# Patient Record
Sex: Female | Born: 2000 | Race: White | Hispanic: No | Marital: Married | Smoking: Light tobacco smoker
Health system: Southern US, Community
[De-identification: ages and names within clinical notes are randomized; demographics above are authoritative.]

## PROBLEM LIST (undated history)

## (undated) DIAGNOSIS — E669 Obesity, unspecified: Secondary | ICD-10-CM

## (undated) DIAGNOSIS — F329 Major depressive disorder, single episode, unspecified: Secondary | ICD-10-CM

## (undated) DIAGNOSIS — F32A Depression, unspecified: Secondary | ICD-10-CM

## (undated) DIAGNOSIS — N912 Amenorrhea, unspecified: Secondary | ICD-10-CM

## (undated) DIAGNOSIS — N926 Irregular menstruation, unspecified: Secondary | ICD-10-CM

## (undated) DIAGNOSIS — G4730 Sleep apnea, unspecified: Secondary | ICD-10-CM

## (undated) DIAGNOSIS — M419 Scoliosis, unspecified: Secondary | ICD-10-CM

## (undated) HISTORY — DX: Obesity, unspecified: E66.9

## (undated) HISTORY — DX: Depression, unspecified: F32.A

## (undated) HISTORY — PX: TYMPANOSTOMY TUBE PLACEMENT: SHX32

## (undated) HISTORY — DX: Amenorrhea, unspecified: N91.2

## (undated) HISTORY — PX: TONSILLECTOMY: SUR1361

## (undated) HISTORY — DX: Irregular menstruation, unspecified: N92.6

## (undated) HISTORY — DX: Major depressive disorder, single episode, unspecified: F32.9

## (undated) HISTORY — PX: TONSILLECTOMY AND ADENOIDECTOMY: SHX28

## (undated) HISTORY — DX: Scoliosis, unspecified: M41.9

---
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
## 2019-01-05 ENCOUNTER — Ambulatory Visit (HOSPITAL_BASED_OUTPATIENT_CLINIC_OR_DEPARTMENT_OTHER): Payer: Medicaid Other | Attending: Internal Medicine | Admitting: Internal Medicine

## 2019-01-05 VITALS — Ht 71.0 in | Wt 356.0 lb

## 2019-01-05 DIAGNOSIS — G4733 Obstructive sleep apnea (adult) (pediatric): Secondary | ICD-10-CM | POA: Insufficient documentation

## 2019-01-05 DIAGNOSIS — R5383 Other fatigue: Secondary | ICD-10-CM | POA: Insufficient documentation

## 2019-01-05 DIAGNOSIS — R0683 Snoring: Secondary | ICD-10-CM | POA: Insufficient documentation

## 2019-01-12 ENCOUNTER — Other Ambulatory Visit: Payer: Self-pay | Admitting: Family Medicine

## 2019-01-12 DIAGNOSIS — F339 Major depressive disorder, recurrent, unspecified: Secondary | ICD-10-CM

## 2019-01-17 DIAGNOSIS — R0683 Snoring: Secondary | ICD-10-CM

## 2019-01-17 NOTE — Procedures (Signed)
  Patient Name: Connie Rivera, Connie Rivera Date: 01/05/2019 Gender: Female D.O.B: Dec 28, 2000 Age (years): 17 Referring Provider: Lucianne Lei Height (inches): 71 Interpreting Physician: Baird Lyons MD, ABSM Weight (lbs): 356 RPSGT: Lanae Boast BMI: 50 MRN: [id number] Neck Size: 18.00  CLINICAL INFORMATION Sleep Study Type: NPSG Indication for sleep study: Snoring Epworth Sleepiness Score: 13  SLEEP STUDY TECHNIQUE As per the AASM Manual for the Scoring of Sleep and Associated Events v2.3 (April 2016) with a hypopnea requiring 4% desaturations.  The channels recorded and monitored were frontal, central and occipital EEG, electrooculogram (EOG), submentalis EMG (chin), nasal and oral airflow, thoracic and abdominal wall motion, anterior tibialis EMG, snore microphone, electrocardiogram, and pulse oximetry.  MEDICATIONS Medications self-administered by patient taken the night of the study : none reported  SLEEP ARCHITECTURE The study was initiated at 11:18:31 PM and ended at 5:20:16 AM.  Sleep onset time was 9.9 minutes and the sleep efficiency was 38.4%%. The total sleep time was 139 minutes.  Stage REM latency was 303.5 minutes.  The patient spent 12.9%% of the night in stage N1 sleep, 77.7%% in stage N2 sleep, 4.3%% in stage N3 and 5% in REM.  Alpha intrusion was absent.  Supine sleep was 100.00%.  RESPIRATORY PARAMETERS The overall apnea/hypopnea index (AHI) was 2.6 per hour. There were 1 total apneas, including 1 obstructive, 0 central and 0 mixed apneas. There were 5 hypopneas and 6 RERAs.  The AHI during Stage REM sleep was 0.0 per hour.  AHI while supine was 2.6 per hour.  The mean oxygen saturation was 89.1%. The minimum SpO2 during sleep was 85.0%.  loud snoring was noted during this study.  CARDIAC DATA The 2 lead EKG demonstrated sinus rhythm. The mean heart rate was 62.6 beats per minute. Other EKG findings include: None.  LEG MOVEMENT DATA The  total PLMS were 0 with a resulting PLMS index of 0.0. Associated arousal with leg movement index was 0.0 .  IMPRESSIONS - No significant obstructive sleep apnea occurred during this study (AHI = 2.6/h). - No significant central sleep apnea occurred during this study (CAI = 0.0/h). - Oxygen desaturation was noted during this study (Min O2 = 85.0%). Supplemental O2 was added at 1 L/min per protocol for sustained saturation 88% or less. Mean sat 92.7%. - Time with saturaation 88% or less was 70.3 minutes. - The patient snored with loud snoring volume. - No cardiac abnormalities were noted during this study. - Clinically significant periodic limb movements did not occur during sleep. No significant associated arousals.  DIAGNOSIS - Nocturnal Hypoxemia (327.26 [G47.36 ICD-10]) - Primary snoring  RECOMMENDATIONS - Evaluate for nocturnal hypoxemia, possibly due to obesity- hypoventilation or cardiopulmonary disease - Be careful with alcohol, sedatives and other CNS depressants that may worsen sleep apnea and disrupt normal sleep architecture. - Sleep hygiene should be reviewed to assess factors that may improve sleep quality. - Weight management and regular exercise should be initiated or continued if appropriate.  [Electronically signed] 01/17/2019 02:58 PM  Baird Lyons MD, [place], American Board of Sleep Medicine   NPI: [id number]                           [place], [facility]  ELECTRONICALLY SIGNED ON:  01/17/2019, 2:53 PM [place] PH: [phone]   FX: [phone] [place]

## 2019-02-06 ENCOUNTER — Ambulatory Visit: Payer: Medicaid Other | Admitting: Family

## 2019-02-15 DIAGNOSIS — G43709 Chronic migraine without aura, not intractable, without status migrainosus: Secondary | ICD-10-CM | POA: Diagnosis not present

## 2019-02-15 DIAGNOSIS — Z01818 Encounter for other preprocedural examination: Secondary | ICD-10-CM | POA: Diagnosis not present

## 2019-02-15 DIAGNOSIS — R41840 Attention and concentration deficit: Secondary | ICD-10-CM | POA: Diagnosis not present

## 2019-02-15 DIAGNOSIS — M41126 Adolescent idiopathic scoliosis, lumbar region: Secondary | ICD-10-CM | POA: Diagnosis not present

## 2019-02-15 DIAGNOSIS — R0683 Snoring: Secondary | ICD-10-CM | POA: Diagnosis not present

## 2019-02-15 DIAGNOSIS — M25562 Pain in left knee: Secondary | ICD-10-CM | POA: Diagnosis not present

## 2019-02-15 DIAGNOSIS — F331 Major depressive disorder, recurrent, moderate: Secondary | ICD-10-CM | POA: Diagnosis not present

## 2019-02-15 DIAGNOSIS — K219 Gastro-esophageal reflux disease without esophagitis: Secondary | ICD-10-CM | POA: Diagnosis not present

## 2019-02-15 DIAGNOSIS — R7303 Prediabetes: Secondary | ICD-10-CM | POA: Diagnosis not present

## 2019-02-15 DIAGNOSIS — Z713 Dietary counseling and surveillance: Secondary | ICD-10-CM | POA: Diagnosis not present

## 2019-02-15 DIAGNOSIS — F419 Anxiety disorder, unspecified: Secondary | ICD-10-CM | POA: Diagnosis not present

## 2019-02-15 DIAGNOSIS — E282 Polycystic ovarian syndrome: Secondary | ICD-10-CM | POA: Diagnosis not present

## 2019-02-21 ENCOUNTER — Encounter: Payer: Self-pay | Admitting: Family

## 2019-02-21 ENCOUNTER — Ambulatory Visit: Payer: Medicaid Other | Admitting: Family Medicine

## 2019-02-21 ENCOUNTER — Ambulatory Visit (INDEPENDENT_AMBULATORY_CARE_PROVIDER_SITE_OTHER): Payer: Medicaid Other | Admitting: Family

## 2019-02-21 VITALS — BP 136/92 | HR 97 | Temp 97.7°F | Ht 71.0 in | Wt 355.0 lb

## 2019-02-21 DIAGNOSIS — J029 Acute pharyngitis, unspecified: Secondary | ICD-10-CM

## 2019-02-21 DIAGNOSIS — Z20818 Contact with and (suspected) exposure to other bacterial communicable diseases: Secondary | ICD-10-CM

## 2019-02-21 LAB — RAPID STREP SCREEN (MED CTR MEBANE ONLY): Strep Gp A Ag, IA W/Reflex: NEGATIVE

## 2019-02-21 LAB — CULTURE, GROUP A STREP

## 2019-02-21 MED ORDER — AMOXICILLIN 875 MG PO TABS: 875.0000 mg | ORAL_TABLET | Freq: Two times a day (BID) | ORAL | 0 refills | Status: DC

## 2019-02-21 NOTE — Patient Instructions (Signed)

## 2019-02-21 NOTE — Progress Notes (Signed)
Subjective:    Patient ID: Connie Rivera, female    DOB: November 04, 2001, 18 y.o.   MRN: [id number]  Chief Complaint  Patient presents with  . Sore Throat    for three days  . Cough   PT presents to the office today with complaints of sore throat that started 3 days ago. Reports her sister was just diagnosed with strep throat.  Sore Throat   This is a new problem. The current episode started in the past 7 days. The problem has been unchanged. There has been no fever. The pain is at a severity of 10/10. The pain is moderate. Associated symptoms include congestion, coughing, ear pain (left), headaches, swollen glands and trouble swallowing. She has had exposure to strep. Exposure to: sister. She has tried acetaminophen and gargles for the symptoms. The treatment provided mild relief.      Review of Systems  HENT: Positive for congestion, ear pain (left) and trouble swallowing.   Respiratory: Positive for cough.   Neurological: Positive for headaches.  All other systems reviewed and are negative.      Objective:   Physical Exam Vitals signs reviewed.  Constitutional:      General: She is not in acute distress.    Appearance: She is well-developed. She is obese.  HENT:     Head: Normocephalic and atraumatic.     Right Ear: External ear normal.     Nose: Mucosal edema and rhinorrhea present.     Mouth/Throat:     Pharynx: Posterior oropharyngeal erythema present.  Eyes:     Pupils: Pupils are equal, round, and reactive to light.  Neck:     Musculoskeletal: Normal range of motion and neck supple.     Thyroid: No thyromegaly.  Cardiovascular:     Rate and Rhythm: Normal rate and regular rhythm.     Heart sounds: Normal heart sounds. No murmur.  Pulmonary:     Effort: Pulmonary effort is normal. No respiratory distress.     Breath sounds: Normal breath sounds. No wheezing.  Abdominal:     General: Bowel sounds are normal. There is no distension.     Palpations: Abdomen is soft.      Tenderness: There is no abdominal tenderness.  Musculoskeletal: Normal range of motion.        General: No tenderness.  Skin:    General: Skin is warm and dry.  Neurological:     Mental Status: She is alert and oriented to person, place, and time.     Cranial Nerves: No cranial nerve deficit.     Deep Tendon Reflexes: Reflexes are normal and symmetric.  Psychiatric:        Behavior: Behavior normal.        Thought Content: Thought content normal.        Judgment: Judgment normal.       BP (!) 136/92   Pulse 97   Temp 97.7 F (36.5 C) (Oral)   Ht 5\' 11"  (1.803 m)   Wt (!) 355 lb (161 kg)   BMI 49.51 kg/m      Assessment & Plan:  MALEEHA HALLS comes in today with chief complaint of Sore Throat (for three days) and Cough   Diagnosis and orders addressed:  1. Sore throat - Rapid Strep Screen ([facility] ONLY)  2. Acute pharyngitis, unspecified etiology - Take meds as prescribed - Use a cool mist humidifier  -Use saline nose sprays frequently -Force fluids -For any cough  or congestion  Use plain Mucinex- regular strength or max strength is fine -For fever or aces or pains- take tylenol or ibuprofen. -Throat lozenges if help -New toothbrush in 3 days RTO if symptoms worsen or do not improve  - amoxicillin (AMOXIL) 875 MG tablet; Take 1 tablet (875 mg total) by mouth 2 (two) times daily.  Dispense: 14 tablet; Refill: 0  3. Exposure to strep throat    [name], FNP

## 2019-02-22 ENCOUNTER — Telehealth: Payer: Self-pay | Admitting: Family

## 2019-02-22 MED ORDER — BENZONATATE 100 MG PO CAPS: 100.0000 mg | ORAL_CAPSULE | Freq: Two times a day (BID) | ORAL | 0 refills | Status: DC | PRN

## 2019-02-22 NOTE — Telephone Encounter (Signed)
Patient aware and verbalizes understanding. 

## 2019-02-22 NOTE — Telephone Encounter (Signed)
I sent in Tessalon Perles for the patient. 

## 2019-02-22 NOTE — Telephone Encounter (Signed)
Please advise if you will send in cough medication.

## 2019-02-28 DIAGNOSIS — H5213 Myopia, bilateral: Secondary | ICD-10-CM | POA: Diagnosis not present

## 2019-05-13 ENCOUNTER — Other Ambulatory Visit: Payer: Self-pay | Admitting: Family

## 2019-05-13 DIAGNOSIS — F339 Major depressive disorder, recurrent, unspecified: Secondary | ICD-10-CM

## 2019-06-19 ENCOUNTER — Institutional Professional Consult (permissible substitution): Payer: Medicaid Other | Admitting: Pulmonary Disease

## 2019-06-22 DIAGNOSIS — Z68.41 Body mass index (BMI) pediatric, greater than or equal to 95th percentile for age: Secondary | ICD-10-CM | POA: Diagnosis not present

## 2019-06-22 DIAGNOSIS — R0683 Snoring: Secondary | ICD-10-CM | POA: Diagnosis not present

## 2019-06-22 DIAGNOSIS — F411 Generalized anxiety disorder: Secondary | ICD-10-CM | POA: Diagnosis not present

## 2019-06-22 DIAGNOSIS — K219 Gastro-esophageal reflux disease without esophagitis: Secondary | ICD-10-CM | POA: Diagnosis not present

## 2019-06-22 DIAGNOSIS — E282 Polycystic ovarian syndrome: Secondary | ICD-10-CM | POA: Diagnosis not present

## 2019-06-22 DIAGNOSIS — F419 Anxiety disorder, unspecified: Secondary | ICD-10-CM | POA: Diagnosis not present

## 2019-06-22 DIAGNOSIS — L732 Hidradenitis suppurativa: Secondary | ICD-10-CM | POA: Diagnosis not present

## 2019-06-22 DIAGNOSIS — R7303 Prediabetes: Secondary | ICD-10-CM | POA: Diagnosis not present

## 2019-06-22 DIAGNOSIS — G43709 Chronic migraine without aura, not intractable, without status migrainosus: Secondary | ICD-10-CM | POA: Diagnosis not present

## 2019-06-22 DIAGNOSIS — F331 Major depressive disorder, recurrent, moderate: Secondary | ICD-10-CM | POA: Diagnosis not present

## 2019-06-22 DIAGNOSIS — Z01818 Encounter for other preprocedural examination: Secondary | ICD-10-CM | POA: Diagnosis not present

## 2019-07-06 ENCOUNTER — Telehealth: Payer: Self-pay | Admitting: Family

## 2019-07-06 NOTE — Telephone Encounter (Signed)
LMOM giving approval, left number should they need anything else

## 2019-07-06 NOTE — Telephone Encounter (Signed)
This is fine 

## 2019-08-22 ENCOUNTER — Other Ambulatory Visit: Payer: Self-pay | Admitting: Family

## 2019-08-22 DIAGNOSIS — F339 Major depressive disorder, recurrent, unspecified: Secondary | ICD-10-CM

## 2019-09-04 ENCOUNTER — Telehealth: Payer: Self-pay | Admitting: Family

## 2019-09-04 DIAGNOSIS — Z3009 Encounter for other general counseling and advice on contraception: Secondary | ICD-10-CM

## 2019-09-04 NOTE — Telephone Encounter (Signed)
Referral placed.

## 2019-10-09 ENCOUNTER — Encounter: Payer: Self-pay | Admitting: Family Medicine

## 2019-10-09 ENCOUNTER — Ambulatory Visit (INDEPENDENT_AMBULATORY_CARE_PROVIDER_SITE_OTHER): Payer: Medicaid Other | Admitting: Family Medicine

## 2019-10-09 DIAGNOSIS — J0100 Acute maxillary sinusitis, unspecified: Secondary | ICD-10-CM

## 2019-10-09 MED ORDER — AMOXICILLIN-POT CLAVULANATE 875-125 MG PO TABS: 1.0000 | ORAL_TABLET | Freq: Two times a day (BID) | ORAL | 0 refills | Status: DC

## 2019-10-09 NOTE — Progress Notes (Signed)
Subjective:    Patient ID: Connie Rivera, female    DOB: April 04, 2001, 18 y.o.   MRN: [id number]   HPI: Connie Rivera is a 18 y.o. female presenting for .Patient presents with upper respiratory congestion. Rhinorrhea that is frequently purulent. There is moderate sore throat. Patient reports coughing frequently as well.  No sputum noted. There is no fever, chills or sweats. The patient denies being short of breath. Onset was 3-5 days ago. Gradually worsening. Tried OTCs without improvement.    Depression screen [facility] 2/9 11/26/2018 04/18/2018 03/09/2018 01/27/2018 01/26/2018  Decreased Interest 0 0 0 0 0  Down, Depressed, Hopeless 0 0 0 0 2  PHQ - 2 Score 0 0 0 0 2  Altered sleeping - 0 3 0 3  Tired, decreased energy - 0 3 0 3  Change in appetite - 0 2 0 2  Feeling bad or failure about yourself  - 0 0 0 2  Trouble concentrating - 0 0 0 3  Moving slowly or fidgety/restless - 0 0 0 0  Suicidal thoughts - 0 0 0 0  PHQ-9 Score - 0 8 0 15  Difficult doing work/chores - - - - Somewhat difficult  Some recent data might be hidden     Relevant past medical, surgical, family and social history reviewed and updated as indicated.  Interim medical history since our last visit reviewed. Allergies and medications reviewed and updated.  ROS:  Review of Systems  Constitutional: Negative for activity change, appetite change, chills and fever.  HENT: Positive for congestion, postnasal drip, rhinorrhea and sinus pressure. Negative for ear discharge, ear pain, hearing loss, nosebleeds, sneezing and trouble swallowing.   Respiratory: Negative for chest tightness and shortness of breath.   Cardiovascular: Negative for chest pain and palpitations.  Skin: Negative for rash.     Social History   Tobacco Use  Smoking Status Former Smoker  . Types: Cigarettes  Smokeless Tobacco Never Used  Tobacco Comment   Patient is vaping off and on.  Not daily       Objective:     Wt Readings from Last 3  Encounters:  02/21/19 (!) 355 lb (161 kg) (>99 %, Z= 2.91)*  01/05/19 (!) 356 lb (161.5 kg) (>99 %, Z= 2.90)*  11/26/18 (!) 365 lb (165.6 kg) (>99 %, Z= 2.92)*   * Growth percentiles are based on CDC (Girls, 2-20 Years) data.     Exam deferred. Pt. Harboring due to COVID 19. Phone visit performed.   Assessment & Plan:   1. Acute maxillary sinusitis, recurrence not specified     Meds ordered this encounter  Medications  . amoxicillin-clavulanate (AUGMENTIN) 875-125 MG tablet    Sig: Take 1 tablet by mouth 2 (two) times daily. Take all of this medication    Dispense:  20 tablet    Refill:  0    No orders of the defined types were placed in this encounter.     Diagnoses and all orders for this visit:  Acute maxillary sinusitis, recurrence not specified  Other orders -     amoxicillin-clavulanate (AUGMENTIN) 875-125 MG tablet; Take 1 tablet by mouth 2 (two) times daily. Take all of this medication    Virtual Visit via telephone Note  I discussed the limitations, risks, security and privacy concerns of performing an evaluation and management service by telephone and the availability of in person appointments. The patient was identified with two identifiers. Pt.expressed understanding and agreed to proceed. Pt.  Is at home. Dr. Livia Snellen is in his office.  Follow Up Instructions:   I discussed the assessment and treatment plan with the patient. The patient was provided an opportunity to ask questions and all were answered. The patient agreed with the plan and demonstrated an understanding of the instructions.   The patient was advised to call back or seek an in-person evaluation if the symptoms worsen or if the condition fails to improve as anticipated.   Total minutes including chart review and phone contact time: 9   Follow up plan: Return if symptoms worsen or fail to improve.  Claretta Fraise, MD [place]

## 2019-10-12 ENCOUNTER — Encounter: Payer: Self-pay | Admitting: Family Medicine

## 2019-10-12 ENCOUNTER — Ambulatory Visit (INDEPENDENT_AMBULATORY_CARE_PROVIDER_SITE_OTHER): Payer: Medicaid Other | Admitting: Family Medicine

## 2019-10-12 DIAGNOSIS — K529 Noninfective gastroenteritis and colitis, unspecified: Secondary | ICD-10-CM

## 2019-10-12 DIAGNOSIS — J069 Acute upper respiratory infection, unspecified: Secondary | ICD-10-CM

## 2019-10-12 MED ORDER — ONDANSETRON 8 MG PO TBDP: 8.0000 mg | ORAL_TABLET | Freq: Four times a day (QID) | ORAL | 1 refills | Status: DC | PRN

## 2019-10-12 NOTE — Progress Notes (Signed)
Subjective:    Patient ID: Connie Rivera, female    DOB: 2001-04-06, 18 y.o.   MRN: [id number]   HPI: Connie Rivera is a 18 y.o. female presenting for vomiting, headache and cough. Temp 99.3 last night. Vomited x2 by 2 pm today. Onset 2 days ago. Had some nausea one day prior. Dry cough. Also has a sore throat. Appetite is off.Had COVID test today through work. She was prescribed Augmentin the day the nausea started. It has not helped, in fact symptoms have progressed.   Depression screen [facility] 2/9 11/26/2018 04/18/2018 03/09/2018 01/27/2018 01/26/2018  Decreased Interest 0 0 0 0 0  Down, Depressed, Hopeless 0 0 0 0 2  PHQ - 2 Score 0 0 0 0 2  Altered sleeping - 0 3 0 3  Tired, decreased energy - 0 3 0 3  Change in appetite - 0 2 0 2  Feeling bad or failure about yourself  - 0 0 0 2  Trouble concentrating - 0 0 0 3  Moving slowly or fidgety/restless - 0 0 0 0  Suicidal thoughts - 0 0 0 0  PHQ-9 Score - 0 8 0 15  Difficult doing work/chores - - - - Somewhat difficult  Some recent data might be hidden     Relevant past medical, surgical, family and social history reviewed and updated as indicated.  Interim medical history since our last visit reviewed. Allergies and medications reviewed and updated.  ROS:  Review of Systems  Constitutional: Positive for appetite change and fever. Negative for activity change and chills.  HENT: Positive for congestion, postnasal drip and rhinorrhea. Negative for ear discharge, ear pain, hearing loss, nosebleeds, sinus pressure, sneezing and trouble swallowing.   Respiratory: Negative for chest tightness and shortness of breath.   Cardiovascular: Negative for chest pain and palpitations.  Gastrointestinal: Positive for nausea and vomiting.  Skin: Negative for rash.     Social History   Tobacco Use  Smoking Status Former Smoker  . Types: Cigarettes  Smokeless Tobacco Never Used  Tobacco Comment   Patient is vaping off and on.  Not daily        Objective:     Wt Readings from Last 3 Encounters:  02/21/19 (!) 355 lb (161 kg) (>99 %, Z= 2.91)*  01/05/19 (!) 356 lb (161.5 kg) (>99 %, Z= 2.90)*  11/26/18 (!) 365 lb (165.6 kg) (>99 %, Z= 2.92)*   * Growth percentiles are based on CDC (Girls, 2-20 Years) data.     Exam deferred. Pt. Harboring due to COVID 19. Phone visit performed.   Assessment & Plan:   1. Gastroenteritis, acute   2. Viral URI     Meds ordered this encounter  Medications  . ondansetron (ZOFRAN-ODT) 8 MG disintegrating tablet    Sig: Take 1 tablet (8 mg total) by mouth every 6 (six) hours as needed for nausea or vomiting.    Dispense:  20 tablet    Refill:  1    No orders of the defined types were placed in this encounter.     Diagnoses and all orders for this visit:  Gastroenteritis, acute  Viral URI  Other orders -     ondansetron (ZOFRAN-ODT) 8 MG disintegrating tablet; Take 1 tablet (8 mg total) by mouth every 6 (six) hours as needed for nausea or vomiting.    Virtual Visit via telephone Note  I discussed the limitations, risks, security and privacy concerns of performing an evaluation and  management service by telephone and the availability of in person appointments. The patient was identified with two identifiers. Pt.expressed understanding and agreed to proceed. Pt. Is at home. Dr. Livia Snellen is in his office.  Follow Up Instructions:   I discussed the assessment and treatment plan with the patient. The patient was provided an opportunity to ask questions and all were answered. The patient agreed with the plan and demonstrated an understanding of the instructions.   The patient was advised to call back or seek an in-person evaluation if the symptoms worsen or if the condition fails to improve as anticipated.   Total minutes including chart review and phone contact time: 12   Follow up plan: Return if symptoms worsen or fail to improve.  Claretta Fraise, MD [place]

## 2019-10-13 ENCOUNTER — Telehealth: Payer: Self-pay | Admitting: Family

## 2019-10-13 NOTE — Telephone Encounter (Signed)
LM for her to be tested if she has not already and to stay on quarantine.

## 2019-11-13 ENCOUNTER — Ambulatory Visit (INDEPENDENT_AMBULATORY_CARE_PROVIDER_SITE_OTHER): Payer: Medicaid Other | Admitting: Family

## 2019-11-13 ENCOUNTER — Encounter: Payer: Self-pay | Admitting: Family

## 2019-11-13 DIAGNOSIS — Z20828 Contact with and (suspected) exposure to other viral communicable diseases: Secondary | ICD-10-CM | POA: Diagnosis not present

## 2019-11-13 DIAGNOSIS — Z20822 Contact with and (suspected) exposure to covid-19: Secondary | ICD-10-CM

## 2019-11-13 MED ORDER — ALBUTEROL SULFATE HFA 108 (90 BASE) MCG/ACT IN AERS: 2.0000 | INHALATION_SPRAY | Freq: Four times a day (QID) | RESPIRATORY_TRACT | 0 refills | Status: DC | PRN

## 2019-11-13 MED ORDER — FLUTICASONE PROPIONATE 50 MCG/ACT NA SUSP: 2.0000 | Freq: Every day | NASAL | 6 refills | Status: DC

## 2019-11-13 NOTE — Progress Notes (Signed)
   Virtual Visit via telephone Note Due to COVID-19 pandemic this visit was conducted virtually. This visit type was conducted due to national recommendations for restrictions regarding the COVID-19 Pandemic (e.g. social distancing, sheltering in place) in an effort to limit this patient's exposure and mitigate transmission in our community. All issues noted in this document were discussed and addressed.  A physical exam was not performed with this format.  I connected with Connie Rivera on 11/13/19 at 10:20 AM  by telephone and verified that I am speaking with the correct person using two identifiers. Connie Rivera is currently located at home  and no one  is currently with her during visit. The provider, [name], FNP is located in their office at time of visit.  I discussed the limitations, risks, security and privacy concerns of performing an evaluation and management service by telephone and the availability of in person appointments. I also discussed with the patient that there may be a patient responsible charge related to this service. The patient expressed understanding and agreed to proceed.   History and Present Illness:  URI  This is a new problem. The current episode started in the past 7 days. The problem has been gradually worsening. There has been no fever. Associated symptoms include congestion, diarrhea, ear pain, headaches, nausea, rhinorrhea, sinus pain and a sore throat. Pertinent negatives include no coughing, sneezing or wheezing. She has tried increased fluids for the symptoms. The treatment provided mild relief.      Review of Systems  HENT: Positive for congestion, ear pain, rhinorrhea, sinus pain and sore throat. Negative for sneezing.   Respiratory: Negative for cough and wheezing.   Gastrointestinal: Positive for diarrhea and nausea.  Neurological: Positive for headaches.  All other systems reviewed and are negative.    Observations/Objective: No SOB or  distress noted, hoarseness noted  Assessment and Plan: 1. Encounter by telehealth for suspected COVID-19 Rest Has COVID test scheduled today at 3, she will call and let us know results Self isolation until results return Tylenol as needed Call if symptoms worsen or do not improve  - albuterol (VENTOLIN HFA) 108 (90 Base) MCG/ACT inhaler; Inhale 2 puffs into the lungs every 6 (six) hours as needed for wheezing or shortness of breath.  Dispense: 18 g; Refill: 0 - fluticasone (FLONASE) 50 MCG/ACT nasal spray; Place 2 sprays into both nostrils daily.  Dispense: 16 g; Refill: 6 - MyChart COVID-19 home monitoring program; Future     I discussed the assessment and treatment plan with the patient. The patient was provided an opportunity to ask questions and all were answered. The patient agreed with the plan and demonstrated an understanding of the instructions.   The patient was advised to call back or seek an in-person evaluation if the symptoms worsen or if the condition fails to improve as anticipated.  The above assessment and management plan was discussed with the patient. The patient verbalized understanding of and has agreed to the management plan. Patient is aware to call the clinic if symptoms persist or worsen. Patient is aware when to return to the clinic for a follow-up visit. Patient educated on when it is appropriate to go to the emergency department.   Time call ended:  10:35 AM  I provided 15 minutes of non-face-to-face time during this encounter.    [name], FNP

## 2019-11-14 ENCOUNTER — Telehealth: Payer: Self-pay

## 2019-11-14 ENCOUNTER — Encounter (INDEPENDENT_AMBULATORY_CARE_PROVIDER_SITE_OTHER): Payer: Self-pay

## 2019-11-14 NOTE — Telephone Encounter (Signed)
Patient advise on cough and diarrhea per protocol:    If cough remains the same or better: continue to treat with over the counter medications. Hard candy or cough drops and drinking warm fluids. Adults can also use honey 2 tsp (10 ML) at bedtime.   HONEY IS NOT RECOMMENDED FOR INFANTS UNDER ONE.   If cough is becoming worse even with the use of over the counter medications and patient is not able to sleep at night, cough becomes productive with sputum that maybe yellow or green in color, contact PCP.   If diarrhea remains the same: encourage patient to drink oral fluids and bland foods.   Avoid alcohol, spicy foods, caffeine or fatty foods that could make diarrhea worse.   Continue to monitor for signs of dehydration (increased thirst decreased urine output, yellow urine, dry skin, headache or dizziness).   Advise patient to try OTC medication (Imodium, kaopectate, Pepto-Bismol) as per manufacturer's instructions.   If worsening diarrhea occurs and becomes severe (6-7 bowel movements a day): notify PCP   If diarrhea last greater than 7 days: notify PCP   IF SIGNS OF DEHYDRATION OCCUR (INCREASED THIRST, DECREASED URINE OUTPUT, YELLOW URINE, DRY SKIN, HEADACHE OR DIZZINESS) ADVISE PATIENT TO CALL 911 AND SEEK TREATMENT IN THE ED  Patient think she has about 4-5 bowel movements a day. Patient verbalized understanding.

## 2019-11-14 NOTE — Telephone Encounter (Signed)
Left a vm for patient to callback regarding mychart questionnaire  

## 2019-11-28 DIAGNOSIS — Z3046 Encounter for surveillance of implantable subdermal contraceptive: Secondary | ICD-10-CM | POA: Diagnosis not present

## 2019-12-03 ENCOUNTER — Emergency Department (HOSPITAL_BASED_OUTPATIENT_CLINIC_OR_DEPARTMENT_OTHER)
Admission: EM | Admit: 2019-12-03 | Discharge: 2019-12-03 | Disposition: A | Discharge status: Home / Self Care | Payer: Medicaid Other | Attending: Emergency Medicine | Admitting: Emergency Medicine

## 2019-12-03 ENCOUNTER — Other Ambulatory Visit: Payer: Self-pay

## 2019-12-03 ENCOUNTER — Encounter (HOSPITAL_BASED_OUTPATIENT_CLINIC_OR_DEPARTMENT_OTHER): Payer: Self-pay | Admitting: Emergency Medicine

## 2019-12-03 DIAGNOSIS — Z79899 Other long term (current) drug therapy: Secondary | ICD-10-CM | POA: Diagnosis not present

## 2019-12-03 DIAGNOSIS — H01005 Unspecified blepharitis left lower eyelid: Secondary | ICD-10-CM

## 2019-12-03 DIAGNOSIS — Z91040 Latex allergy status: Secondary | ICD-10-CM | POA: Insufficient documentation

## 2019-12-03 DIAGNOSIS — F17210 Nicotine dependence, cigarettes, uncomplicated: Secondary | ICD-10-CM | POA: Diagnosis not present

## 2019-12-03 DIAGNOSIS — J45909 Unspecified asthma, uncomplicated: Secondary | ICD-10-CM | POA: Diagnosis not present

## 2019-12-03 DIAGNOSIS — H02845 Edema of left lower eyelid: Secondary | ICD-10-CM | POA: Diagnosis present

## 2019-12-03 DIAGNOSIS — H01002 Unspecified blepharitis right lower eyelid: Secondary | ICD-10-CM | POA: Diagnosis not present

## 2019-12-03 MED ORDER — ERYTHROMYCIN 5 MG/GM OP OINT: TOPICAL_OINTMENT | OPHTHALMIC | 0 refills | Status: DC

## 2019-12-03 NOTE — ED Notes (Signed)
Pt verbalized understanding of dc instructions.

## 2019-12-03 NOTE — Discharge Instructions (Addendum)
Please continue to use warm compresses and clean eyelids twice a day.  Avoid any eye make-up do not began wearing contact lenses.  You may use over-the-counter eyelid scrub or mild shampoo twice a day to clean eyelids.  If you continue to have symptoms or if they worsen please call [facility] ophthalmology for an appointment.  Number is below [phone]

## 2019-12-03 NOTE — ED Triage Notes (Signed)
Pt c/o left eye swelling x 2 days. Pt reports "fuzzy vision, denies injury. Reports no relief with otc medications

## 2019-12-03 NOTE — ED Provider Notes (Signed)
[facility] EMERGENCY DEPARTMENT Provider Note   CSN: [id number] Arrival date & time: 12/03/19  1053     History Chief Complaint  Patient presents with  . Eye swelling    Connie Rivera is a 18 y.o. female.  HPI Patient is 18 year old female presenting today for left lower eyelid swelling and itchiness with some occasional discomfort.  Patient does not use contact lenses wears last was for vision.  States that she has occasional blurry vision that clears with blinking.  Patient states that swelling has gotten moderately worse over the last 2 days.  States it is sore and itchy endorses some mild crusting.  Patient denies any orbital pain decreased visual acuity, decreased range of motion of eyes blind spots or floaters or any transient vision loss.  Eye trauma or foreign body sensation, no eye itchiness, no pain with blinking.  She states that she is used warm compresses with no improvement.  Past Medical History:  Diagnosis Date  . Amenorrhea 07/10/2015  . Asthma   . Depression   . Irregular periods 07/10/2015  . Obesity   . Scoliosis   . Sleep apnea     Patient Active Problem List   Diagnosis Date Noted  . Dorsal cervical fat pad 02/24/2018  . Insulin resistance 04/21/2017  . Morbid obesity ([place]) 04/14/2017  . Acanthosis nigricans, acquired 04/14/2017  . Post-traumatic headache, not intractable 11/23/2016  . GAD (generalized anxiety disorder) 10/22/2016  . Recurrent major depressive disorder ([place]) 03/04/2016  . PTSD (post-traumatic stress disorder) 02/03/2016  . Rhinitis, allergic 11/12/2015  . Irregular periods 07/10/2015  . Morbid childhood obesity with BMI greater than 99th percentile for age [facility]) 07/10/2015  . Amenorrhea 07/10/2015  . Migraine without aura and without status migrainosus, not intractable 12/19/2014  . Tension headache 12/19/2014    Past Surgical History:  Procedure Laterality Date  . TONSILLECTOMY    . TONSILLECTOMY AND  ADENOIDECTOMY    . TONSILLECTOMY AND ADENOIDECTOMY    . TYMPANOSTOMY TUBE PLACEMENT       OB History    Gravida  0   Para  0   Term  0   Preterm  0   AB  0   Living  0     SAB  0   TAB  0   Ectopic  0   Multiple  0   Live Births              Family History  Problem Relation Age of Onset  . Hyperlipidemia Father   . Hypertension Father   . Migraines Mother   . Febrile seizures Sister        Resolved  . ADD / ADHD Sister   . Anxiety disorder Sister   . Bipolar disorder Sister   . Migraines Maternal Grandmother   . Diabetes Maternal Grandfather   . Kidney disease Maternal Grandfather   . Hypertension Maternal Grandfather   . Hyperlipidemia Maternal Grandfather   . Heart murmur Maternal Grandfather   . Rheumatic fever Maternal Grandfather   . Cancer Paternal Grandmother        breast  . COPD Paternal Grandmother   . Emphysema Paternal Grandmother   . Congestive Heart Failure Paternal Grandmother   . Cancer Paternal Grandfather   . Heart attack Neg Hx   . Sudden death Neg Hx     Social History   Tobacco Use  . Smoking status: Light Tobacco Smoker    Types: Cigarettes  .  Smokeless tobacco: Never Used  . Tobacco comment: Patient is vaping off and on.  Not daily  Substance Use Topics  . Alcohol use: No  . Drug use: No    Home Medications Prior to Admission medications   Medication Sig Start Date End Date Taking? Authorizing Provider  albuterol (VENTOLIN HFA) 108 (90 Base) MCG/ACT inhaler Inhale 2 puffs into the lungs every 6 (six) hours as needed for wheezing or shortness of breath. 11/13/19   [name] A, FNP  amoxicillin-clavulanate (AUGMENTIN) 875-125 MG tablet Take 1 tablet by mouth 2 (two) times daily. Take all of this medication 10/09/19   Claretta Fraise, MD  benzonatate (TESSALON) 100 MG capsule Take 1 capsule (100 mg total) by mouth 2 (two) times daily as needed for cough. 02/22/19   Dettinger, Fransisca Kaufmann, MD  clindamycin (CLEOCIN T) 1 %  external solution Apply 1 application topically 2 (two) times a day. 12/07/18   [provider]  clindamycin (CLEOCIN) 300 MG capsule Take 300 mg by mouth 2 (two) times daily. 02/15/19   [provider]  erythromycin ophthalmic ointment Place a 1/2 inch ribbon of ointment into the lower eyelid. 12/03/19   Katrece Roediger, Kathleene Hazel, PA  escitalopram (LEXAPRO) 10 MG tablet TAKE ONE TABLET BY MOUTH EVERY DAY. NEED APPOINTMENT FOR REFILLS 05/16/19   [name] A, FNP  fluticasone (FLONASE) 50 MCG/ACT nasal spray Place 2 sprays into both nostrils daily. 11/13/19   Sharion Balloon, FNP  metFORMIN (GLUCOPHAGE-XR) 500 MG 24 hr tablet Take by mouth. 02/15/19 02/15/20  [provider]  ondansetron (ZOFRAN-ODT) 8 MG disintegrating tablet Take 1 tablet (8 mg total) by mouth every 6 (six) hours as needed for nausea or vomiting. 10/12/19   Claretta Fraise, MD  pantoprazole (PROTONIX) 40 MG tablet Take 1 tablet (40 mg total) by mouth 2 (two) times daily. 02/01/18   Claretta Fraise, MD  phentermine 15 MG capsule Take by mouth. 02/15/19 05/16/19  [provider]  Spacer/Aero Chamber Mouthpiece MISC 1 each by Does not apply route every 6 (six) hours as needed. 04/15/17   Eustaquio Maize, MD  topiramate (TOPAMAX) 100 MG tablet Take 1 tablet (100 mg total) by mouth daily. 02/15/19   [provider]  Vitamin D, Ergocalciferol, (DRISDOL) 1.25 MG (50000 UT) CAPS capsule Take by mouth. 02/15/19   [provider]    Allergies    Pineapple, Adhesive [tape], and Latex  Review of Systems   Review of Systems  Constitutional: Negative for fever.  HENT: Negative for congestion.   Eyes: Negative for pain and redness.       Eyelid swelling  Respiratory: Negative for shortness of breath.   Cardiovascular: Negative for chest pain.  Gastrointestinal: Negative for abdominal distention.  Neurological: Negative for dizziness and headaches.    Physical Exam Updated Vital Signs BP 122/77  (BP Location: Right Arm)   Pulse 87   Temp 98.1 F (36.7 C) (Oral)   Resp 18   LMP 10/31/2019 (Approximate)   SpO2 99%   Physical Exam Vitals and nursing note reviewed.  Constitutional:      General: She is not in acute distress.    Appearance: Normal appearance. She is not ill-appearing.  HENT:     Head: Normocephalic and atraumatic.  Eyes:     General: No scleral icterus.       Right eye: No discharge.        Left eye: No discharge.     Conjunctiva/sclera: Conjunctivae normal.  Comments: EOMI, PERRLA, left eye with lower lid swelling tenderness to palpation scant discharge present.   Pulmonary:     Effort: Pulmonary effort is normal.     Breath sounds: No stridor.  Neurological:     Mental Status: She is alert and oriented to person, place, and time. Mental status is at baseline.     ED Results / Procedures / Treatments   Labs (all labs ordered are listed, but only abnormal results are displayed) Labs Reviewed - No data to display  EKG None  Radiology No results found.  Procedures Procedures (including critical care time)  Medications Ordered in ED Medications - No data to display  ED Course  I have reviewed the triage vital signs and the nursing notes.  Pertinent labs & imaging results that were available during my care of the patient were reviewed by me and considered in my medical decision making (see chart for details).    MDM Rules/Calculators/A&P  Presenting-year-old female presenting with left lower lid swelling and discomfort.  This is been ongoing for 4 days.  Has tried warm compresses with no improvement.  Suspect that this is blepharitis.  Will treat with topical erythromycin as patient is having poor results from conservative therapy with warm compresses.  Recommended follow-up with ophthalmology if symptoms continue or worsen.  Doubt corneal abrasion as she has no foreign body sensation, doubt ulcer as she does not wear contact lenses.    Patient visual acuity is approximately symmetric.  Doubt any emergent cause of her symptoms.  As discussed above I considered emergent etiologies such as CRAO central retinal venous occlusion, acute angle-closure glaucoma as well as stye, hordeolum, blepharitis, conjunctivitis, corneal abrasion.  Discussed with patient of each of these.  If she continues to have issues.  I do suspect blepharitis is etiology of her symptoms today will treat with erythromycin.  This will also cover for bacterial conjunctivitis.  If etiology of her symptoms are due to hordeolum I suspect warm compresses lid hygiene antibiotic ointment will medicate her symptoms.  Patient understanding of plan understanding of symptoms to return to ED for.  Will follow up with ophthalmologist if she has persistent symptoms return to ED if she has any new or concerning sudden changes.     This patient appears reasonably screened and I doubt any other medical condition requiring further workup, evaluation, or treatment in the ED at this time prior to discharge.   Patient's vitals are WNL apart from vital sign abnormalities discussed above, patient is in NAD, and able to ambulate in the ED at their baseline. Pain has been managed or a plan has been made for home management and has no complaints prior to discharge. Patient is comfortable with above plan and is stable for discharge at this time. All questions were answered prior to disposition. Results from the ER workup discussed with the patient face to face and all questions answered to the best of my ability. The patient is safe for discharge with strict return precautions. Patient appears safe for discharge with appropriate follow-up. Conveyed my impression with the patient and they voiced understanding and are agreeable to plan.   An After Visit Summary was printed and given to the patient.  Portions of this note were generated with [employer]. Dictation errors may  occur despite best attempts at proofreading.     Final Clinical Impression(s) / ED Diagnoses Final diagnoses:  Blepharitis of left lower eyelid, unspecified type    Rx / DC  Orders ED Discharge Orders         Ordered    erythromycin ophthalmic ointment     12/03/19 1134           Pati Gallo [place], [place] 12/03/19 2125    Lajean Saver, MD 12/04/19 1358

## 2020-01-01 ENCOUNTER — Telehealth: Payer: Self-pay | Admitting: Family

## 2020-01-02 ENCOUNTER — Encounter: Payer: Self-pay | Admitting: Physician Assistant

## 2020-01-02 ENCOUNTER — Ambulatory Visit (INDEPENDENT_AMBULATORY_CARE_PROVIDER_SITE_OTHER): Payer: Medicaid Other | Admitting: Physician Assistant

## 2020-01-02 DIAGNOSIS — R197 Diarrhea, unspecified: Secondary | ICD-10-CM

## 2020-01-02 MED ORDER — HYOSCYAMINE SULFATE 0.125 MG PO TABS: 0.1250 mg | ORAL_TABLET | ORAL | 0 refills | Status: DC | PRN

## 2020-01-02 NOTE — Progress Notes (Signed)
Telephone visit  Subjective: [id number] PCP: Sharion Balloon, FNP [id number] ANELA ALLEMANG is a 19 y.o. female calls for telephone consult today. Patient provides verbal consent for consult held via phone.  Patient is identified with 2 separate identifiers.  At this time the entire area is on COVID-19 social distancing and stay home orders are in place.  Patient is of higher risk and therefore we are performing this by a virtual method.  Location of patient: home Location of provider: HOME Others present for call: no   Patient reports that she has had some diarrhea over the past 3 days.  She denies having any significant issues with irritable bowel syndrome diarrhea.  She normally has good bowel function and routine.  She states that her temperature was 99.2.  She did not feel fevers or chills but it was a little bit higher than normal.  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  She will let us know if there is anything abnormal.  At this time I do feel it is just a gastroenteritis.  She is having some nausea but denies any vomiting whatsoever.  She is able to keep food and fluids down.   ROS: Per HPI  Allergies  Allergen Reactions  . Pineapple Anaphylaxis and Itching  . Adhesive [Tape] Rash  . Latex Rash   Past Medical History:  Diagnosis Date  . Amenorrhea 07/10/2015  . Asthma   . Depression   . Irregular periods 07/10/2015  . Obesity   . Scoliosis   . Sleep apnea     Current Outpatient Medications:  .  albuterol (VENTOLIN HFA) 108 (90 Base) MCG/ACT inhaler, Inhale 2 puffs into the lungs every 6 (six) hours as needed for wheezing or shortness of breath., Disp: 18 g, Rfl: 0 .  benzonatate (TESSALON) 100 MG capsule, Take 1 capsule (100 mg total) by mouth 2 (two) times daily as needed for cough., Disp: 20 capsule, Rfl: 0 .  clindamycin (CLEOCIN T) 1  % external solution, Apply 1 application topically 2 (two) times a day., Disp: , Rfl:  .  erythromycin ophthalmic ointment, Place a 1/2 inch ribbon of ointment into the lower eyelid., Disp: 1 g, Rfl: 0 .  escitalopram (LEXAPRO) 10 MG tablet, TAKE ONE TABLET BY MOUTH EVERY DAY. NEED APPOINTMENT FOR REFILLS, Disp: 30 tablet, Rfl: 1 .  fluticasone (FLONASE) 50 MCG/ACT nasal spray, Place 2 sprays into both nostrils daily., Disp: 16 g, Rfl: 6 .  hyoscyamine (LEVSIN) 0.125 MG tablet, Take 1 tablet (0.125 mg total) by mouth every 4 (four) hours as needed., Disp: 10 tablet, Rfl: 0 .  metFORMIN (GLUCOPHAGE-XR) 500 MG 24 hr tablet, Take by mouth., Disp: , Rfl:  .  ondansetron (ZOFRAN-ODT) 8 MG disintegrating tablet, Take 1 tablet (8 mg total) by mouth every 6 (six) hours as needed for nausea or vomiting., Disp: 20 tablet, Rfl: 1 .  pantoprazole (PROTONIX) 40 MG tablet, Take 1 tablet (40 mg total) by mouth 2 (two) times daily., Disp: 60 tablet, Rfl: 2 .  Spacer/Aero Chamber Mouthpiece MISC, 1 each by Does not apply route every 6 (six) hours as needed., Disp: 1 each, Rfl: 0 .  topiramate (TOPAMAX) 100 MG tablet, Take 1 tablet (100 mg total) by mouth daily., Disp: , Rfl:  .  Vitamin D, Ergocalciferol, (DRISDOL) 1.25  MG (50000 UT) CAPS capsule, Take by mouth., Disp: , Rfl:   Assessment/ Plan: 19 y.o. female   1. Diarrhea, unspecified type Levsin 0.125 mg up to 4 times in a day for abdominal cramping Prescription is sent to the pharmacy   No follow-ups on file.  Continue all other maintenance medications as listed above.  Start time: 10:08 AM End time: 10:18 AM  Meds ordered this encounter  Medications  . hyoscyamine (LEVSIN) 0.125 MG tablet    Sig: Take 1 tablet (0.125 mg total) by mouth every 4 (four) hours as needed.    Dispense:  10 tablet    Refill:  0    Order Specific Question:   Supervising Provider    Answer:   Janora Norlander [id number]    Particia Nearing PA-C [place] [phone]

## 2020-01-02 NOTE — Telephone Encounter (Signed)
ok 

## 2020-01-14 DIAGNOSIS — M4185 Other forms of scoliosis, thoracolumbar region: Secondary | ICD-10-CM | POA: Diagnosis not present

## 2020-01-14 DIAGNOSIS — M545 Low back pain: Secondary | ICD-10-CM | POA: Diagnosis not present

## 2020-01-14 DIAGNOSIS — R200 Anesthesia of skin: Secondary | ICD-10-CM | POA: Diagnosis not present

## 2020-01-19 ENCOUNTER — Other Ambulatory Visit: Payer: Self-pay

## 2020-01-21 NOTE — Progress Notes (Deleted)
   Assessment & Plan:  ***  Follow up plan: No follow-ups on file.  [name], MSN, APRN, FNP-C [facility]  Subjective:   Patient ID: Connie Rivera, female    DOB: 07/02/2001, 19 y.o.   MRN: [id number]  HPI: Connie Rivera is a 19 y.o. female presenting on 01/23/2020 for No chief complaint on file.  Hip Pain: Patient complains of left hip pain. Onset of the symptoms was {onset:14048}. Inciting event: {inciting event:14107}. Current symptoms include {hip sx:14103}. Associated symptoms: {knee pain associated sx:14087::"none"}. Aggravating symptoms: {pain aggravating factors:14088}. Patient's {symptom progression:14259}. Patient {has had:32492} prior hip problems. Previous visits for this problem: {seen previously:14038}. Evaluation to date: {hip pain eval to date:14106}.  Treatment to date: {hip pain tx to date:14105}.   ROS: Negative unless specifically indicated above in HPI.   Relevant past medical history reviewed and updated as indicated.   Allergies and medications reviewed and updated.   Current Outpatient Medications:  .  albuterol (VENTOLIN HFA) 108 (90 Base) MCG/ACT inhaler, Inhale 2 puffs into the lungs every 6 (six) hours as needed for wheezing or shortness of breath., Disp: 18 g, Rfl: 0 .  benzonatate (TESSALON) 100 MG capsule, Take 1 capsule (100 mg total) by mouth 2 (two) times daily as needed for cough., Disp: 20 capsule, Rfl: 0 .  clindamycin (CLEOCIN T) 1 % external solution, Apply 1 application topically 2 (two) times a day., Disp: , Rfl:  .  erythromycin ophthalmic ointment, Place a 1/2 inch ribbon of ointment into the lower eyelid., Disp: 1 g, Rfl: 0 .  escitalopram (LEXAPRO) 10 MG tablet, TAKE ONE TABLET BY MOUTH EVERY DAY. NEED APPOINTMENT FOR REFILLS, Disp: 30 tablet, Rfl: 1 .  fluticasone (FLONASE) 50 MCG/ACT nasal spray, Place 2 sprays into both nostrils daily., Disp: 16 g, Rfl: 6 .  hyoscyamine (LEVSIN) 0.125 MG tablet, Take 1 tablet  (0.125 mg total) by mouth every 4 (four) hours as needed., Disp: 10 tablet, Rfl: 0 .  metFORMIN (GLUCOPHAGE-XR) 500 MG 24 hr tablet, Take by mouth., Disp: , Rfl:  .  ondansetron (ZOFRAN-ODT) 8 MG disintegrating tablet, Take 1 tablet (8 mg total) by mouth every 6 (six) hours as needed for nausea or vomiting., Disp: 20 tablet, Rfl: 1 .  pantoprazole (PROTONIX) 40 MG tablet, Take 1 tablet (40 mg total) by mouth 2 (two) times daily., Disp: 60 tablet, Rfl: 2 .  Spacer/Aero Chamber Mouthpiece MISC, 1 each by Does not apply route every 6 (six) hours as needed., Disp: 1 each, Rfl: 0 .  topiramate (TOPAMAX) 100 MG tablet, Take 1 tablet (100 mg total) by mouth daily., Disp: , Rfl:  .  Vitamin D, Ergocalciferol, (DRISDOL) 1.25 MG (50000 UT) CAPS capsule, Take by mouth., Disp: , Rfl:   Allergies  Allergen Reactions  . Pineapple Anaphylaxis and Itching  . Adhesive [Tape] Rash  . Latex Rash    Objective:   There were no vitals taken for this visit.   Physical Exam

## 2020-01-23 ENCOUNTER — Ambulatory Visit: Payer: Medicaid Other | Admitting: Family Medicine

## 2020-01-24 ENCOUNTER — Encounter: Payer: Self-pay | Admitting: Family

## 2020-02-08 ENCOUNTER — Telehealth: Payer: Self-pay | Admitting: *Deleted

## 2020-02-08 NOTE — Telephone Encounter (Signed)
TTC pt to reschedule pt from [facility] on 01/23/20 but voicemail was full, couldn't leave message.

## 2020-03-04 DIAGNOSIS — R110 Nausea: Secondary | ICD-10-CM | POA: Diagnosis not present

## 2020-03-04 DIAGNOSIS — R519 Headache, unspecified: Secondary | ICD-10-CM | POA: Diagnosis not present

## 2020-03-11 ENCOUNTER — Ambulatory Visit (INDEPENDENT_AMBULATORY_CARE_PROVIDER_SITE_OTHER): Payer: Medicaid Other | Admitting: Family

## 2020-03-11 ENCOUNTER — Other Ambulatory Visit: Payer: Self-pay

## 2020-03-11 ENCOUNTER — Encounter: Payer: Self-pay | Admitting: Family

## 2020-03-11 VITALS — BP 136/81 | HR 83 | Temp 98.0°F | Ht 71.4 in | Wt 348.2 lb

## 2020-03-11 DIAGNOSIS — M79641 Pain in right hand: Secondary | ICD-10-CM | POA: Diagnosis not present

## 2020-03-11 DIAGNOSIS — M778 Other enthesopathies, not elsewhere classified: Secondary | ICD-10-CM

## 2020-03-11 MED ORDER — DICLOFENAC SODIUM 75 MG PO TBEC: 75.0000 mg | DELAYED_RELEASE_TABLET | Freq: Two times a day (BID) | ORAL | 0 refills | Status: DC

## 2020-03-11 MED ORDER — PREDNISONE 10 MG (21) PO TBPK: ORAL_TABLET | ORAL | 0 refills | Status: DC

## 2020-03-11 NOTE — Progress Notes (Signed)
Subjective:    Patient ID: Connie Rivera, female    DOB: 2001-07-20, 19 y.o.   MRN: [id number]  Chief Complaint  Patient presents with  . Hand Pain    right side. Been going on for about a year     Hand Pain  The incident occurred more than 1 week ago. There was no injury mechanism. The pain is present in the right hand. The pain is at a severity of 10/10. The pain is moderate. The pain has been constant since the incident. Pertinent negatives include no numbness or tingling. The symptoms are aggravated by movement and lifting. She has tried heat and NSAIDs for the symptoms. The treatment provided mild relief.      Review of Systems  Neurological: Negative for tingling and numbness.  All other systems reviewed and are negative.      Objective:   Physical Exam Vitals reviewed.  Constitutional:      General: She is not in acute distress.    Appearance: She is well-developed.  HENT:     Head: Normocephalic and atraumatic.  Eyes:     Pupils: Pupils are equal, round, and reactive to light.  Neck:     Thyroid: No thyromegaly.  Cardiovascular:     Rate and Rhythm: Normal rate and regular rhythm.     Heart sounds: Normal heart sounds. No murmur.  Pulmonary:     Effort: Pulmonary effort is normal. No respiratory distress.     Breath sounds: Normal breath sounds. No wheezing.  Abdominal:     General: Bowel sounds are normal. There is no distension.     Palpations: Abdomen is soft.     Tenderness: There is no abdominal tenderness.  Musculoskeletal:        General: Tenderness present. Normal range of motion.       Arms:     Cervical back: Normal range of motion and neck supple.     Comments: Full ROM of right hand, tenderness of right palm with palpation and mild swelling noted  Skin:    General: Skin is warm and dry.  Neurological:     Mental Status: She is alert and oriented to person, place, and time.     Cranial Nerves: No cranial nerve deficit.     Deep Tendon  Reflexes: Reflexes are normal and symmetric.  Psychiatric:        Behavior: Behavior normal.        Thought Content: Thought content normal.        Judgment: Judgment normal.      BP 136/81   Pulse 83   Temp 98 F (36.7 C) (Temporal)   Ht 5' 11.4" (1.814 m)   Wt (!) 348 lb 3.2 oz (157.9 kg)   SpO2 96%   BMI 48.02 kg/m       Assessment & Plan:  Connie Rivera comes in today with chief complaint of Hand Pain (right side. Been going on for about a year )   Diagnosis and orders addressed:  1. Right hand pain - diclofenac (VOLTAREN) 75 MG EC tablet; Take 1 tablet (75 mg total) by mouth 2 (two) times daily.  Dispense: 30 tablet; Refill: 0 - predniSONE (STERAPRED UNI-PAK 21 TAB) 10 MG (21) TBPK tablet; Use as directed  Dispense: 21 tablet; Refill: 0  2. Right hand tendonitis - diclofenac (VOLTAREN) 75 MG EC tablet; Take 1 tablet (75 mg total) by mouth 2 (two) times daily.  Dispense: 30 tablet; Refill: 0 -  predniSONE (STERAPRED UNI-PAK 21 TAB) 10 MG (21) TBPK tablet; Use as directed  Dispense: 21 tablet; Refill: 0  Rest Ice  ROM exercises enocuraged No other NSAID's while taking Diclofenac  Take with food RTO as needed, if pain continues will need to see hand specialists   Connie Dun, FNP

## 2020-03-11 NOTE — Patient Instructions (Signed)
Tendinitis  Tendinitis is inflammation of a tendon. A tendon is a strong cord of tissue that connects muscle to bone. Tendinitis can affect any tendon, but it most commonly affects the:  Shoulder tendon (rotator cuff).  Ankle tendon (Achilles tendon).  Elbow tendon (triceps tendon).  Tendons in the wrist. What are the causes? This condition may be caused by:  Overusing a tendon or muscle. This is common.  Age-related wear and tear.  Injury.  Inflammatory conditions, such as arthritis.  Certain medicines. What increases the risk? You are more likely to develop this condition if you do activities that involve the same movements over and over again (repetitive motions). What are the signs or symptoms? Symptoms of this condition may include:  Pain.  Tenderness.  Mild swelling.  Decreased range of motion. How is this diagnosed? This condition is diagnosed with a physical exam. You may also have tests, such as:  Ultrasound. This uses sound waves to make an image of the inside of your body in the affected area.  MRI. How is this treated? This condition may be treated by resting, icing, applying pressure (compression), and raising (elevating) the affected area above the level of your heart. This is known as RICE therapy. Treatment may also include:  Medicines to help reduce inflammation or to help reduce pain.  Exercises or physical therapy to strengthen and stretch the tendon.  A brace or splint.  Surgery. This is rarely needed. Follow these instructions at home: If you have a splint or brace:  Wear the splint or brace as told by your health care provider. Remove it only as told by your health care provider.  Loosen the splint or brace if your fingers or toes tingle, become numb, or turn cold and blue.  Keep the splint or brace clean.  If the splint or brace is not waterproof: ? Do not let it get wet. ? Cover it with a watertight covering when you take a bath  or shower. Managing pain, stiffness, and swelling  If directed, put ice on the affected area. ? If you have a removable splint or brace, remove it as told by your health care provider. ? Put ice in a plastic bag. ? Place a towel between your skin and the bag. ? Leave the ice on for 20 minutes, 2-3 times a day.  Move the fingers or toes of the affected limb often, if this applies. This can help to prevent stiffness and lessen swelling.  If directed, raise (elevate) the affected area above the level of your heart while you are sitting or lying down.  If directed, apply heat to the affected area before you exercise. Use the heat source that your health care provider recommends, such as a moist heat pack or a heating pad.     ? Place a towel between your skin and the heat source. ? Leave the heat on for 20-30 minutes. ? Remove the heat if your skin turns bright red. This is especially important if you are unable to feel pain, heat, or cold. You may have a greater risk of getting burned. Driving  Do not drive or use heavy machinery while taking prescription pain medicine.  Ask your health care provider when it is safe to drive if you have a splint or brace on any part of your arm or leg. Activity  Rest the affected area as told by your health care provider.  Return to your normal activities as told by your health care   provider. Ask your health care provider what activities are safe for you.  Avoid using the affected area while you are experiencing symptoms of tendinitis.  Do exercises as told by your health care provider. General instructions  If you have a splint, do not put pressure on any part of the splint until it is fully hardened. This may take several hours.  Wear an elastic bandage or compression wrap only as told by your health care provider.  Take over-the-counter and prescription medicines only as told by your health care provider.  Keep all follow-up visits as told  by your health care provider. This is important. Contact a health care provider if:  Your symptoms do not improve.  You develop new, unexplained problems, such as numbness in your hands. Summary  Tendinitis is inflammation of a tendon.  You are more likely to develop this condition if you do activities that involve the same movements over and over again.  This condition may be treated by resting, icing, applying pressure (compression), and elevating the area above the level of your heart. This is known as RICE therapy.  Avoid using the affected area while you are experiencing symptoms of tendinitis. This information is not intended to replace advice given to you by your health care provider. Make sure you discuss any questions you have with your health care provider. Document Revised: 06/14/2018 Document Reviewed: 04/27/2018 Elsevier Patient Education  2020 Elsevier Inc.  

## 2020-03-18 ENCOUNTER — Telehealth (INDEPENDENT_AMBULATORY_CARE_PROVIDER_SITE_OTHER): Payer: Medicaid Other | Admitting: Family Medicine

## 2020-03-18 ENCOUNTER — Encounter: Payer: Self-pay | Admitting: Family Medicine

## 2020-03-18 DIAGNOSIS — R002 Palpitations: Secondary | ICD-10-CM

## 2020-03-18 DIAGNOSIS — F411 Generalized anxiety disorder: Secondary | ICD-10-CM

## 2020-03-18 DIAGNOSIS — R61 Generalized hyperhidrosis: Secondary | ICD-10-CM | POA: Diagnosis not present

## 2020-03-18 MED ORDER — ESCITALOPRAM OXALATE 10 MG PO TABS: 10.0000 mg | ORAL_TABLET | Freq: Every day | ORAL | 5 refills | Status: DC

## 2020-03-18 NOTE — Progress Notes (Signed)
Virtual Visit via MyChart Video Note Due to COVID-19 pandemic this visit was conducted virtually. This visit type was conducted due to national recommendations for restrictions regarding the COVID-19 Pandemic (e.g. social distancing, sheltering in place) in an effort to limit this patient's exposure and mitigate transmission in our community. All issues noted in this document were discussed and addressed.  A physical exam was not performed with this format.   I connected with Connie Rivera on 03/18/2020 at 1650 by telephone and verified that I am speaking with the correct person using two identifiers. Connie Rivera is currently located at home and family is currently with them during visit. The provider, Monia Pouch, FNP is located in their office at time of visit.  I discussed the limitations, risks, security and privacy concerns of performing an evaluation and management service by telephone and the availability of in person appointments. I also discussed with the patient that there may be a patient responsible charge related to this service. The patient expressed understanding and agreed to proceed.  Subjective:  Patient ID: Connie Rivera, female    DOB: August 21, 2001, 19 y.o.   MRN: [id number]  Chief Complaint:  Night Sweats   HPI: Connie Rivera is a 19 y.o. female presenting on 03/18/2020 for Night Sweats   Pt reports night sweats, palpitations, and increased anxiety. States she was on anxiety medications in the past and these were stopped by her bariatric surgeon in preparation for surgery. States she was not able to have the surgery due to COVID-19 and feels she needs to be back on her medications to help mitigate her symptoms. States the night sweats, palpitations, and anxiety started a few days after discontinuing the medications. States this has worsened over the last several weeks. She denies SI or HI. No chest pain, shortness of breath, fever, weight loss, chills, known sick contacts,  travel to endemic areas, or fatigue.   Depression screen [facility] 2/9 03/18/2020 11/26/2018 04/18/2018 03/09/2018 01/27/2018  Decreased Interest 0 0 0 0 0  Down, Depressed, Hopeless 0 0 0 0 0  PHQ - 2 Score 0 0 0 0 0  Altered sleeping - - 0 3 0  Tired, decreased energy - - 0 3 0  Change in appetite - - 0 2 0  Feeling bad or failure about yourself  - - 0 0 0  Trouble concentrating - - 0 0 0  Moving slowly or fidgety/restless - - 0 0 0  Suicidal thoughts - - 0 0 0  PHQ-9 Score - - 0 8 0  Difficult doing work/chores - - - - -  Some recent data might be hidden   GAD 7 : Generalized Anxiety Score 03/18/2020  Nervous, Anxious, on Edge 1  Control/stop worrying 1  Worry too much - different things 1  Trouble relaxing 1  Restless 1  Easily annoyed or irritable 1  Afraid - awful might happen 1  Total GAD 7 Score 7      Relevant past medical, surgical, family, and social history reviewed and updated as indicated.  Allergies and medications reviewed and updated.   Past Medical History:  Diagnosis Date  . Amenorrhea 07/10/2015  . Asthma   . Depression   . Irregular periods 07/10/2015  . Obesity   . Scoliosis   . Sleep apnea     Past Surgical History:  Procedure Laterality Date  . TONSILLECTOMY    . TONSILLECTOMY AND ADENOIDECTOMY    . TONSILLECTOMY AND ADENOIDECTOMY    .  TYMPANOSTOMY TUBE PLACEMENT      Social History   Socioeconomic History  . Marital status: Single    Spouse name: Not on file  . Number of children: Not on file  . Years of education: Not on file  . Highest education level: Not on file  Occupational History  . Not on file  Tobacco Use  . Smoking status: Light Tobacco Smoker    Types: Cigarettes  . Smokeless tobacco: Never Used  . Tobacco comment: Patient is vaping off and on.  Not daily  Substance and Sexual Activity  . Alcohol use: No  . Drug use: No  . Sexual activity: Yes    Birth control/protection: Condom, Implant  Other Topics Concern  . 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   She attends [employer].   She lives with both parents and her cousin.   She enjoys basketball, hanging with friends, and reading.   Social Determinants of Health   Financial Resource Strain:   . Difficulty of Paying Living Expenses:   Food Insecurity:   . Worried About Charity fundraiser in the Last Year:   . Arboriculturist in the Last Year:   Transportation Needs:   . Film/video editor (Medical):   Marland Kitchen Lack of Transportation (Non-Medical):   Physical Activity:   . Days of Exercise per Week:   . Minutes of Exercise per Session:   Stress:   . Feeling of Stress :   Social Connections:   . Frequency of Communication with Friends and Family:   . Frequency of Social Gatherings with Friends and Family:   . Attends Religious Services:   . Active Member of Clubs or Organizations:   . Attends Archivist Meetings:   Marland Kitchen Marital Status:   Intimate Partner Violence:   . Fear of Current or Ex-Partner:   . Emotionally Abused:   Marland Kitchen Physically Abused:   . Sexually Abused:     Outpatient Encounter Medications as of 03/18/2020  Medication Sig  . diclofenac (VOLTAREN) 75 MG EC tablet Take 1 tablet (75 mg total) by mouth 2 (two) times daily.  Marland Kitchen escitalopram (LEXAPRO) 10 MG tablet Take 1 tablet (10 mg total) by mouth daily.  . metFORMIN (GLUCOPHAGE-XR) 500 MG 24 hr tablet Take by mouth.  . [DISCONTINUED] predniSONE (STERAPRED UNI-PAK 21 TAB) 10 MG (21) TBPK tablet Use as directed   No facility-administered encounter medications on file as of 03/18/2020.    Allergies  Allergen Reactions  . Pineapple Anaphylaxis and Itching  . Adhesive [Tape] Rash  . Latex Rash    Review of Systems  Constitutional: Negative for activity change, appetite change, chills, diaphoresis, fatigue, fever and unexpected weight change.       Night sweats  HENT: Negative.   Eyes: Negative.  Negative for photophobia and  visual disturbance.  Respiratory: Negative for cough, chest tightness and shortness of breath.   Cardiovascular: Positive for palpitations. Negative for chest pain and leg swelling.  Gastrointestinal: Negative for abdominal pain, blood in stool, constipation, diarrhea, nausea and vomiting.  Endocrine: Negative.   Genitourinary: Negative for decreased urine volume, difficulty urinating, dysuria, frequency and urgency.  Musculoskeletal: Negative for arthralgias and myalgias.  Skin: Negative.   Allergic/Immunologic: Negative.   Neurological: Negative for dizziness, tremors, seizures, syncope, facial asymmetry, speech difficulty, weakness, light-headedness, numbness and headaches.  Hematological: Negative.   Psychiatric/Behavioral: Positive for agitation, decreased concentration, dysphoric mood  and sleep disturbance. Negative for behavioral problems, confusion, hallucinations, self-injury and suicidal ideas. The patient is nervous/anxious. The patient is not hyperactive.   All other systems reviewed and are negative.        Observations/Objective: No vital signs or physical exam, this was a telephone or virtual health encounter.  Pt alert and oriented, answers all questions appropriately, and able to speak in full sentences.    Assessment and Plan: Connie Rivera was seen today for night sweats.  Diagnoses and all orders for this visit:  Night sweats Palpitations No recent sick exposures or travel to endemic areas. Started shortly after stopping SSRI for GAD. Will check below for potential underlying causes.  -     CBC with Differential/Platelet; Future -     CMP14+EGFR; Future -     Thyroid Panel With TSH; Future  GAD (generalized anxiety disorder) Ongoing and worsening anxiety symptoms since medications were discontinued by bariatric surgeon. Will restart Lexapro at 10 mg daily. Pt to follow up with PCP in 2 weeks for reevaluation. Report any new, worsening, or persistent symptoms. Follow up  as scheduled.  -     escitalopram (LEXAPRO) 10 MG tablet; Take 1 tablet (10 mg total) by mouth daily.     Follow Up Instructions: Return in about 2 weeks (around 04/01/2020), or if symptoms worsen or fail to improve, for GAD.    I discussed the assessment and treatment plan with the patient. The patient was provided an opportunity to ask questions and all were answered. The patient agreed with the plan and demonstrated an understanding of the instructions.   The patient was advised to call back or seek an in-person evaluation if the symptoms worsen or if the condition fails to improve as anticipated.  The above assessment and management plan was discussed with the patient. The patient verbalized understanding of and has agreed to the management plan. Patient is aware to call the clinic if they develop any new symptoms or if symptoms persist or worsen. Patient is aware when to return to the clinic for a follow-up visit. Patient educated on when it is appropriate to go to the emergency department.    I provided 15 minutes of non-face-to-face time during this encounter. The call started at 1650. The call ended at 1705pt. The other time was used for coordination of care.    Monia Pouch, FNP-C [facility] [address] [place], [place] [address] [phone] 03/18/2020

## 2020-04-01 DIAGNOSIS — J069 Acute upper respiratory infection, unspecified: Secondary | ICD-10-CM | POA: Diagnosis not present

## 2020-04-16 DIAGNOSIS — Z20822 Contact with and (suspected) exposure to covid-19: Secondary | ICD-10-CM | POA: Diagnosis not present

## 2020-04-16 DIAGNOSIS — J029 Acute pharyngitis, unspecified: Secondary | ICD-10-CM | POA: Diagnosis not present

## 2020-04-16 DIAGNOSIS — B338 Other specified viral diseases: Secondary | ICD-10-CM | POA: Diagnosis not present

## 2020-06-10 ENCOUNTER — Ambulatory Visit (INDEPENDENT_AMBULATORY_CARE_PROVIDER_SITE_OTHER): Payer: Medicaid Other | Admitting: Family Medicine

## 2020-06-10 DIAGNOSIS — G44209 Tension-type headache, unspecified, not intractable: Secondary | ICD-10-CM | POA: Diagnosis not present

## 2020-06-10 MED ORDER — DICLOFENAC SODIUM 75 MG PO TBEC: 75.0000 mg | DELAYED_RELEASE_TABLET | Freq: Two times a day (BID) | ORAL | 0 refills | Status: DC | PRN

## 2020-06-10 NOTE — Patient Instructions (Signed)
Tension Headache, Adult A tension headache is pain, pressure, or aching in your head. Tension headaches can last from 30 minutes to several days. Follow these instructions at home: Managing pain  Take over-the-counter and prescription medicines only as told by your doctor.  When you have a headache, lie down in a dark, quiet room.  If told, put ice on your head and neck: ? Put ice in a plastic bag. ? Place a towel between your skin and the bag. ? Leave the ice on for 20 minutes, 2-3 times a day.  If told, put heat on the back of your neck. Do this as often as your doctor tells you to. Use the kind of heat that your doctor recommends, such as a moist heat pack or a heating pad. ? Place a towel between your skin and the heat. ? Leave the heat on for 20-30 minutes. ? Remove the heat if your skin turns bright red. Eating and drinking  Eat meals on a regular schedule.  Watch how much alcohol you drink: ? If you are a woman and are not pregnant, do not drink more than 1 drink a day. ? If you are a man, do not drink more than 2 drinks a day.  Drink enough fluid to keep your pee (urine) pale yellow.  Do not use a lot of caffeine, or stop using caffeine. Lifestyle  Get enough sleep. Get 7-9 hours of sleep each night. Or get the amount of sleep that your doctor tells you to.  At bedtime, remove all electronic devices from your room. Examples of electronic devices are computers, phones, and tablets.  Find ways to lessen your stress. Some things that can lessen stress are: ? Exercise. ? Deep breathing. ? Yoga. ? Music. ? Positive thoughts.  Sit up straight. Do not tighten (tense) your muscles.  Do not use any products that have nicotine or tobacco in them, such as cigarettes and e-cigarettes. If you need help quitting, ask your doctor. General instructions   Keep all follow-up visits as told by your doctor. This is important.  Avoid things that can bring on headaches. Keep a  journal to find out if certain things bring on headaches. For example, write down: ? What you eat and drink. ? How much sleep you get. ? Any change to your diet or medicines. Contact a doctor if:  Your headache does not get better.  Your headache comes back.  You have a headache and sounds, light, or smells bother you.  You feel sick to your stomach (nauseous) or you throw up (vomit).  Your stomach hurts. Get help right away if:  You suddenly get a very bad headache along with any of these: ? A stiff neck. ? Feeling sick to your stomach. ? Throwing up. ? Feeling weak. ? Trouble seeing. ? Feeling short of breath. ? A rash. ? Feeling unusually sleepy. ? Trouble speaking. ? Pain in your eye or ear. ? Trouble walking or balancing. ? Feeling like you will pass out (faint). ? Passing out. Summary  A tension headache is pain, pressure, or aching in your head.  Tension headaches can last from 30 minutes to several days.  Lifestyle changes and medicines may help relieve pain. This information is not intended to replace advice given to you by your health care provider. Make sure you discuss any questions you have with your health care provider. Document Revised: 10/04/2019 Document Reviewed: 03/19/2017 Elsevier Patient Education  2020 Elsevier Inc.  

## 2020-06-10 NOTE — Progress Notes (Signed)
Telephone visit  Subjective: CC: headache PCP: Sharion Balloon, FNP LSL:[facility]DSK TEMPERENCE ZENOR is a 19 y.o. female calls for telephone consult today. Patient provides verbal consent for consult held via phone.  Due to COVID-19 pandemic this visit was conducted virtually. This visit type was conducted due to national recommendations for restrictions regarding the COVID-19 Pandemic (e.g. social distancing, sheltering in place) in an effort to limit this patient's exposure and mitigate transmission in our community. All issues noted in this document were discussed and addressed.  A physical exam was not performed with this format.   Location of patient: home Location of provider: [facility] Others present for call: none  1. Headache Patient reports onset of migraine headache intermittently.  The most recent one started 3 days ago.  It is behind her eyes and extends to her temples.  No nausea, vomiting, photophobia, phonophobia.  She notes it is not relieved by tylenol, motrin, benadryl.  She is hydrating adequately.  She reports good rest.  LMP >1 year ago.  She is not pregnant, she takes monthly pregnancy tests due to absent periods.   ROS: Per HPI  Allergies  Allergen Reactions  . Pineapple Anaphylaxis and Itching  . Adhesive [Tape] Rash  . Latex Rash   Past Medical History:  Diagnosis Date  . Amenorrhea 07/10/2015  . Asthma   . Depression   . Irregular periods 07/10/2015  . Obesity   . Scoliosis   . Sleep apnea     Current Outpatient Medications:  .  diclofenac (VOLTAREN) 75 MG EC tablet, Take 1 tablet (75 mg total) by mouth 2 (two) times daily., Disp: 30 tablet, Rfl: 0 .  escitalopram (LEXAPRO) 10 MG tablet, Take 1 tablet (10 mg total) by mouth daily., Disp: 30 tablet, Rfl: 5 .  metFORMIN (GLUCOPHAGE-XR) 500 MG 24 hr tablet, Take by mouth., Disp: , Rfl:   Assessment/ Plan: 19 y.o. female   1. Tension headache Hydration, adequate rest.  Diclofenac twice daily as needed.  If ongoing, low  threshold to have her seen at headache clinic.  She will contact us if this is needed.  Work note provided.  She understands red flag signs and symptoms.  Avoid other NSAIDs with diclofenac.  Does not appear to be a migraine headache at this point.   Start time: 11:19am End time: 11:24pm  Total time spent on patient care (including telephone call/ virtual visit): 10 minutes  [place], [place] [phone]

## 2020-07-21 DIAGNOSIS — H5213 Myopia, bilateral: Secondary | ICD-10-CM | POA: Diagnosis not present

## 2020-07-25 ENCOUNTER — Encounter: Payer: Self-pay | Admitting: Family

## 2020-07-25 ENCOUNTER — Other Ambulatory Visit: Payer: Self-pay

## 2020-07-25 ENCOUNTER — Other Ambulatory Visit (HOSPITAL_COMMUNITY)
Admission: RE | Admit: 2020-07-25 | Discharge: 2020-07-25 | Disposition: A | Discharge status: Home / Self Care | Payer: Medicaid Other | Source: Ambulatory Visit | Attending: Family | Admitting: Family

## 2020-07-25 ENCOUNTER — Ambulatory Visit (INDEPENDENT_AMBULATORY_CARE_PROVIDER_SITE_OTHER): Payer: Medicaid Other | Admitting: Family

## 2020-07-25 VITALS — BP 123/80 | HR 81 | Temp 97.6°F | Ht 71.45 in | Wt 361.4 lb

## 2020-07-25 DIAGNOSIS — Z113 Encounter for screening for infections with a predominantly sexual mode of transmission: Secondary | ICD-10-CM

## 2020-07-25 DIAGNOSIS — D179 Benign lipomatous neoplasm, unspecified: Secondary | ICD-10-CM

## 2020-07-25 NOTE — Progress Notes (Signed)
   Subjective:    Patient ID: Connie Rivera, female    DOB: 07-12-2001, 19 y.o.   MRN: [id number]  Chief Complaint  Patient presents with  . Mass    up under right breast     HPI Pt presents to the office today with complaints of a mass on her LUQ abdomen that she noticed about a month ago. She states she thought she was just gaining weight, but her mother noticed it. Denies any tenderness, warmth, or erythemas.   Denies any injury.      Review of Systems  All other systems reviewed and are negative.      Objective:   Physical Exam Vitals reviewed.  Constitutional:      General: She is not in acute distress.    Appearance: She is well-developed.  HENT:     Head: Normocephalic and atraumatic.     Right Ear: Tympanic membrane normal.     Left Ear: Tympanic membrane normal.  Eyes:     Pupils: Pupils are equal, round, and reactive to light.  Neck:     Thyroid: No thyromegaly.  Cardiovascular:     Rate and Rhythm: Normal rate and regular rhythm.     Heart sounds: Normal heart sounds. No murmur heard.   Pulmonary:     Effort: Pulmonary effort is normal. No respiratory distress.     Breath sounds: Normal breath sounds. No wheezing.  Abdominal:     General: Bowel sounds are normal. There is no distension.     Palpations: Abdomen is soft.     Tenderness: There is no abdominal tenderness.  Musculoskeletal:        General: No tenderness. Normal range of motion.     Cervical back: Normal range of motion and neck supple.  Skin:    General: Skin is warm and dry.  Neurological:     Mental Status: She is alert and oriented to person, place, and time.     Cranial Nerves: No cranial nerve deficit.     Deep Tendon Reflexes: Reflexes are normal and symmetric.  Psychiatric:        Behavior: Behavior normal.        Thought Content: Thought content normal.        Judgment: Judgment normal.       BP 123/80   Pulse 81   Temp 97.6 F (36.4 C) (Temporal)   Ht 5' 11.45"  (1.815 m)   Wt (!) 361 lb 6.4 oz (163.9 kg)   SpO2 94%   BMI 49.77 kg/m      Assessment & Plan:  MOUNA YAGER comes in today with chief complaint of Mass (up under right breast )   Diagnosis and orders addressed:  1. Lipoma, unspecified site Probable lipoma, given morbid obese and weight gain Do not pick or squeeze Will do [place]  - [place] Miscellaneo[place] Localization; Future  2. Screen for STD (sexually transmitted disease) - GC/Chlamydia probe amp ([place])not at [facility]  3. Morbid obesity ([place])   [name], FNP

## 2020-07-25 NOTE — Patient Instructions (Signed)
Lipoma  A lipoma is a noncancerous (benign) tumor that is made up of fat cells. This is a very common type of soft-tissue growth. Lipomas are usually found under the skin (subcutaneous). They may occur in any tissue of the body that contains fat. Common areas for lipomas to appear include the back, arms, shoulders, buttocks, and thighs. Lipomas grow slowly, and they are usually painless. Most lipomas do not cause problems and do not require treatment. What are the causes? The cause of this condition is not known. What increases the risk? You are more likely to develop this condition if:  You are 40-60 years old.  You have a family history of lipomas. What are the signs or symptoms? A lipoma usually appears as a small, round bump under the skin. In most cases, the lump will:  Feel soft or rubbery.  Not cause pain or other symptoms. However, if a lipoma is located in an area where it pushes on nerves, it can become painful or cause other symptoms. How is this diagnosed? A lipoma can usually be diagnosed with a physical exam. You may also have tests to confirm the diagnosis and to rule out other conditions. Tests may include:  Imaging tests, such as a CT scan or an MRI.  Removal of a tissue sample to be looked at under a microscope (biopsy). How is this treated? Treatment for this condition depends on the size of the lipoma and whether it is causing any symptoms.  For small lipomas that are not causing problems, no treatment is needed.  If a lipoma is bigger or it causes problems, surgery may be done to remove the lipoma. Lipomas can also be removed to improve appearance. Most often, the procedure is done after applying a medicine that numbs the area (local anesthetic).  Liposuction may be done to reduce the size of the lipoma before it is removed through surgery, or it may be done to remove the lipoma. Lipomas are removed with this method in order to limit incision size and scarring. A  liposuction tube is inserted through a small incision into the lipoma, and the contents of the lipoma are removed through the tube with suction. Follow these instructions at home:  Watch your lipoma for any changes.  Keep all follow-up visits as told by your health care provider. This is important. Contact a health care provider if:  Your lipoma becomes larger or hard.  Your lipoma becomes painful, red, or increasingly swollen. These could be signs of infection or a more serious condition. Get help right away if:  You develop tingling or numbness in an area near the lipoma. This could indicate that the lipoma is causing nerve damage. Summary  A lipoma is a noncancerous tumor that is made up of fat cells.  Most lipomas do not cause problems and do not require treatment.  If a lipoma is bigger or it causes problems, surgery may be done to remove the lipoma.  Contact a health care provider if your lipoma becomes larger or hard, or if it becomes painful, red, or increasingly swollen. Pain, redness, and swelling could be signs of infection or a more serious condition. This information is not intended to replace advice given to you by your health care provider. Make sure you discuss any questions you have with your health care provider. Document Revised: 07/24/2019 Document Reviewed: 07/24/2019 Elsevier Patient Education  2020 Elsevier Inc.  

## 2020-07-26 LAB — GC/CHLAMYDIA PROBE AMP (~~LOC~~) NOT AT ARMC
Chlamydia: NEGATIVE
Comment: NEGATIVE
Comment: NORMAL
Neisseria Gonorrhea: NEGATIVE

## 2020-07-26 NOTE — Addendum Note (Signed)
Addended by: [name] A on: 07/26/2020 12:27 PM   Modules accepted: Orders

## 2020-07-31 ENCOUNTER — Ambulatory Visit: Payer: Medicaid Other

## 2020-08-08 ENCOUNTER — Other Ambulatory Visit: Payer: Self-pay | Admitting: Family

## 2020-08-08 ENCOUNTER — Ambulatory Visit (HOSPITAL_COMMUNITY)
Admission: RE | Admit: 2020-08-08 | Discharge: 2020-08-08 | Disposition: A | Discharge status: Home / Self Care | Payer: Medicaid Other | Source: Ambulatory Visit | Attending: Family | Admitting: Family

## 2020-08-08 ENCOUNTER — Other Ambulatory Visit: Payer: Self-pay

## 2020-08-08 DIAGNOSIS — R221 Localized swelling, mass and lump, neck: Secondary | ICD-10-CM | POA: Diagnosis not present

## 2020-08-08 DIAGNOSIS — D179 Benign lipomatous neoplasm, unspecified: Secondary | ICD-10-CM | POA: Diagnosis not present

## 2020-10-07 ENCOUNTER — Ambulatory Visit (INDEPENDENT_AMBULATORY_CARE_PROVIDER_SITE_OTHER): Payer: Medicaid Other | Admitting: Family Medicine

## 2020-10-07 ENCOUNTER — Other Ambulatory Visit: Payer: Self-pay

## 2020-10-07 ENCOUNTER — Encounter: Payer: Self-pay | Admitting: Family Medicine

## 2020-10-07 VITALS — BP 122/83 | HR 76 | Temp 97.8°F | Ht 71.45 in | Wt 362.4 lb

## 2020-10-07 DIAGNOSIS — Z23 Encounter for immunization: Secondary | ICD-10-CM

## 2020-10-07 DIAGNOSIS — N912 Amenorrhea, unspecified: Secondary | ICD-10-CM | POA: Diagnosis not present

## 2020-10-07 DIAGNOSIS — G8929 Other chronic pain: Secondary | ICD-10-CM | POA: Diagnosis not present

## 2020-10-07 DIAGNOSIS — M25512 Pain in left shoulder: Secondary | ICD-10-CM

## 2020-10-07 MED ORDER — MELOXICAM 7.5 MG PO TABS: 7.5000 mg | ORAL_TABLET | Freq: Every day | ORAL | 0 refills | Status: AC

## 2020-10-07 MED ORDER — MEDROXYPROGESTERONE ACETATE 10 MG PO TABS: 10.0000 mg | ORAL_TABLET | Freq: Every day | ORAL | 0 refills | Status: DC

## 2020-10-07 NOTE — Progress Notes (Signed)
Subjective: CC: amenorrhea, arm pain PCP: Sharion Balloon, FNP  Connie Rivera is a 19 y.o. female presenting to clinic today for:  1. Amenorrhea Connie Rivera has  a history of PCOS with heavy, irregular periods. Has tried birth control before without having regular periods. Had nexplanon from 9/17 to 12/20. She has not had a cycle since. She denies current pregnancy. She is married and has started thinking about having children at some point and would like to start having regular cycles so that she can try to conceive when she is ready.   2. Left shoulder pain She reports left shoulder pain for 10 months that comes and goes. The pain is a 7/10 at times. The pain is starts at the front of her should and goes down to her elbow on the inside. It also sometimes extends up her neck and causes a headache. The pain feels "like its on fire", it waxes and wanes. It is worse with overhead lifting. She denies injury or falls. 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 She has tried tylenol, advil, heat, ice, icy hot patches and rub without relief. She denies numbness or tingling in her fingers. She is interested in PT as she has had a good experience in the past with other joint pain.     Relevant past medical, surgical, family, and social history reviewed and updated as indicated.  Allergies and medications reviewed and updated.  Allergies  Allergen Reactions  . Pineapple Anaphylaxis and Itching  . Adhesive [Tape] Rash  . Latex Rash   Past Medical History:  Diagnosis Date  . Amenorrhea 07/10/2015  . Asthma   . Depression   . Irregular periods 07/10/2015  . Obesity   . Scoliosis   . Sleep apnea     Current Outpatient Medications:  .  Nutritional Supplements (KETO PO), Take by mouth., Disp: , Rfl:  .  Orlistat (ALLI PO), Take by mouth., Disp: , Rfl:  Social History   Socioeconomic History  . Marital status: Single    Spouse name: Not on file  . Number of  children: Not on file  . Years of education: Not on file  . Highest education level: Not on file  Occupational History  . Not on file  Tobacco Use  . Smoking status: Light Tobacco Smoker    Types: Cigarettes  . Smokeless tobacco: Never Used  . Tobacco comment: Patient is vaping off and on.  Not daily  Vaping Use  . Vaping Use: Never used  Substance and Sexual Activity  . Alcohol use: No  . Drug use: No  . Sexual activity: Yes    Birth control/protection: Condom, Implant  Other Topics Concern  . 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   She attends [employer].   She lives with both parents and her cousin.   She enjoys basketball, hanging with friends, and reading.   Social Determinants of Health   Financial Resource Strain:   . Difficulty of Paying Living Expenses: Not on file  Food Insecurity:   . Worried About Charity fundraiser in the Last Year: Not on file  . Ran Out of Food in the Last Year: Not on file  Transportation Needs:   . Lack of Transportation (Medical): Not on file  . Lack of Transportation (Non-Medical): Not on file  Physical Activity:   . Days of Exercise per Week:  Not on file  . Minutes of Exercise per Session: Not on file  Stress:   . Feeling of Stress : Not on file  Social Connections:   . Frequency of Communication with Friends and Family: Not on file  . Frequency of Social Gatherings with Friends and Family: Not on file  . Attends Religious Services: Not on file  . Active Member of Clubs or Organizations: Not on file  . Attends Archivist Meetings: Not on file  . Marital Status: Not on file  Intimate Partner Violence:   . Fear of Current or Ex-Partner: Not on file  . Emotionally Abused: Not on file  . Physically Abused: Not on file  . Sexually Abused: Not on file   Family History  Problem Relation Age of Onset  . Hyperlipidemia Father   . Hypertension Father   . Migraines Mother   .  Febrile seizures Sister        Resolved  . ADD / ADHD Sister   . Anxiety disorder Sister   . Bipolar disorder Sister   . Migraines Maternal Grandmother   . Diabetes Maternal Grandfather   . Kidney disease Maternal Grandfather   . Hypertension Maternal Grandfather   . Hyperlipidemia Maternal Grandfather   . Heart murmur Maternal Grandfather   . Rheumatic fever Maternal Grandfather   . Cancer Paternal Grandmother        breast  . COPD Paternal Grandmother   . Emphysema Paternal Grandmother   . Congestive Heart Failure Paternal Grandmother   . Cancer Paternal Grandfather   . Heart attack Neg Hx   . Sudden death Neg Hx     Review of Systems  Per HPI.   Objective: Office vital signs reviewed. BP 122/83   Pulse 76   Temp 97.8 F (36.6 C) (Temporal)   Ht 5' 11.45" (1.815 m)   Wt (!) 362 lb 6.4 oz (164.4 kg)   SpO2 97%   BMI 49.91 kg/m   Physical Examination:  Physical Exam Vitals and nursing note reviewed.  Constitutional:      General: She is not in acute distress.    Appearance: She is not ill-appearing or diaphoretic.  Cardiovascular:     Rate and Rhythm: Normal rate and regular rhythm.     Heart sounds: Normal heart sounds. No murmur heard.   Pulmonary:     Effort: Pulmonary effort is normal. No respiratory distress.     Breath sounds: Normal breath sounds.  Musculoskeletal:     Left shoulder: No swelling, effusion, bony tenderness or crepitus. Normal range of motion. Normal strength.     Right lower leg: No edema.     Left lower leg: No edema.     Comments: Mild TTP along anterior left shoulder and medial bicep of left arm. No warmth or erythema.   Skin:    General: Skin is warm and dry.  Neurological:     General: No focal deficit present.     Mental Status: She is alert and oriented to person, place, and time.  Psychiatric:        Mood and Affect: Mood normal.        Behavior: Behavior normal.      Assessment/ Plan: Connie Rivera was seen today for  ammenorrhea and arm pain.  Diagnoses and all orders for this visit:  Amenorrhea Provera 10 mg x 10 days. If regular cycles not established after, she should follow up with her GYN.  -     medroxyPROGESTERone (  PROVERA) 10 MG tablet; Take 1 tablet (10 mg total) by mouth daily.  Chronic left shoulder pain No alarm signs today.  ?bicep tendonitis vs cervical radiculopathy. Patient declined prednisone today. Start mobic. Referral to PT. Return to office for new or worsening symptoms, or if symptoms persist.  -     Ambulatory referral to Physical Therapy -     meloxicam (MOBIC) 7.5 MG tablet; Take 1 tablet (7.5 mg total) by mouth daily.  Need for immunization against influenza Vaccine today in office.  -     Flu Vaccine QUAD 36+ mos IM  Follow up as needed.   The above assessment and management plan was discussed with the patient. The patient verbalized understanding of and has agreed to the management plan. Patient is aware to call the clinic if symptoms persist or worsen. Patient is aware when to return to the clinic for a follow-up visit. Patient educated on when it is appropriate to go to the emergency department.   Marjorie Smolder, FNP-C [facility] [address] [place], [place] [address] [phone]

## 2020-10-07 NOTE — Patient Instructions (Signed)
Shoulder Pain Many things can cause shoulder pain, including:  An injury to the shoulder.  Overuse of the shoulder.  Arthritis. The source of the pain can be:  Inflammation.  An injury to the shoulder joint.  An injury to a tendon, ligament, or bone. Follow these instructions at home: Pay attention to changes in your symptoms. Let your health care provider know about them. Follow these instructions to relieve your pain. If you have a sling:  Wear the sling as told by your health care provider. Remove it only as told by your health care provider.  Loosen the sling if your fingers tingle, become numb, or turn cold and blue.  Keep the sling clean.  If the sling is not waterproof: ? Do not let it get wet. Remove it to shower or bathe.  Move your arm as little as possible, but keep your hand moving to prevent swelling. Managing pain, stiffness, and swelling   If directed, put ice on the painful area: ? Put ice in a plastic bag. ? Place a towel between your skin and the bag. ? Leave the ice on for 20 minutes, 2-3 times per day. Stop applying ice if it does not help with the pain.  Squeeze a soft ball or a foam pad as much as possible. This helps to keep the shoulder from swelling. It also helps to strengthen the arm. General instructions  Take over-the-counter and prescription medicines only as told by your health care provider.  Keep all follow-up visits as told by your health care provider. This is important. Contact a health care provider if:  Your pain gets worse.  Your pain is not relieved with medicines.  New pain develops in your arm, hand, or fingers. Get help right away if:  Your arm, hand, or fingers: ? Tingle. ? Become numb. ? Become swollen. ? Become painful. ? Turn white or blue. Summary  Shoulder pain can be caused by an injury, overuse, or arthritis.  Pay attention to changes in your symptoms. Let your health care provider know about  them.  This condition may be treated with a sling, ice, and pain medicines.  Contact your health care provider if the pain gets worse or new pain develops. Get help right away if your arm, hand, or fingers tingle or become numb, swollen, or painful.  Keep all follow-up visits as told by your health care provider. This is important. This information is not intended to replace advice given to you by your health care provider. Make sure you discuss any questions you have with your health care provider. Document Revised: 06/21/2018 Document Reviewed: 06/21/2018 Elsevier Patient Education  [place]. Polycystic Ovarian Syndrome  Polycystic ovarian syndrome (PCOS) is a common hormonal disorder among women of reproductive age. In most women with PCOS, many small fluid-filled sacs (cysts) grow on the ovaries, and the cysts are not part of a normal menstrual cycle. PCOS can cause problems with your menstrual periods and make it difficult to get pregnant. It can also cause an increased risk of miscarriage with pregnancy. If it is not treated, PCOS can lead to serious health problems, such as diabetes and heart disease. What are the causes? The cause of PCOS is not known, but it may be the result of a combination of certain factors, such as:  Irregular menstrual cycle.  High levels of certain hormones (androgens).  Problems with the hormone that helps to control blood sugar (insulin resistance).  Certain genes. What increases the  risk? This condition is more likely to develop in women who have a family history of PCOS. What are the signs or symptoms? Symptoms of PCOS may include:  Multiple ovarian cysts.  Infrequent periods or no periods.  Periods that are too frequent or too heavy.  Unpredictable periods.  Inability to get pregnant (infertility) because of not ovulating.  Increased growth of hair on the face, chest, stomach, back, thumbs, thighs, or toes.  Acne or oily skin.  Acne may develop during adulthood, and it may not respond to treatment.  Pelvic pain.  Weight gain or obesity.  Patches of thickened and dark brown or black skin on the neck, arms, breasts, or thighs (acanthosis nigricans).  Excess hair growth on the face, chest, abdomen, or upper thighs (hirsutism). How is this diagnosed? This condition is diagnosed based on:  Your medical history.  A physical exam, including a pelvic exam. Your health care provider may look for areas of increased hair growth on your skin.  Tests, such as: ? Ultrasound. This may be used to examine the ovaries and the lining of the uterus (endometrium) for cysts. ? Blood tests. These may be used to check levels of sugar (glucose), female hormone (testosterone), and female hormones (estrogen and progesterone) in your blood. How is this treated? There is no cure for PCOS, but treatment can help to manage symptoms and prevent more health problems from developing. Treatment varies depending on:  Your symptoms.  Whether you want to have a baby or whether you need birth control (contraception). Treatment may include nutrition and lifestyle changes along with:  Progesterone hormone to start a menstrual period.  Birth control pills to help you have regular menstrual periods.  Medicines to make you ovulate, if you want to get pregnant.  Medicine to reduce excessive hair growth.  Surgery, in severe cases. This may involve making small holes in one or both of your ovaries. This decreases the amount of testosterone that your body produces. Follow these instructions at home:  Take over-the-counter and prescription medicines only as told by your health care provider.  Follow a healthy meal plan. This can help you reduce the effects of PCOS. ? Eat a healthy diet that includes lean proteins, complex carbohydrates, fresh fruits and vegetables, low-fat dairy products, and healthy fats. Make sure to eat enough fiber.  If you  are overweight, lose weight as told by your health care provider. ? Losing 10% of your body weight may improve symptoms. ? Your health care provider can determine how much weight loss is best for you and can help you lose weight safely.  Keep all follow-up visits as told by your health care provider. This is important. Contact a health care provider if:  Your symptoms do not get better with medicine.  You develop new symptoms. This information is not intended to replace advice given to you by your health care provider. Make sure you discuss any questions you have with your health care provider. Document Revised: 11/19/2017 Document Reviewed: 05/24/2016 Elsevier Patient Education  2020 [employer].

## 2020-10-14 ENCOUNTER — Other Ambulatory Visit: Payer: Self-pay

## 2020-10-14 ENCOUNTER — Ambulatory Visit: Payer: Medicaid Other | Attending: Family Medicine | Admitting: Physical Therapy

## 2020-10-14 DIAGNOSIS — G8929 Other chronic pain: Secondary | ICD-10-CM

## 2020-10-14 DIAGNOSIS — M25512 Pain in left shoulder: Secondary | ICD-10-CM | POA: Insufficient documentation

## 2020-10-14 DIAGNOSIS — M6281 Muscle weakness (generalized): Secondary | ICD-10-CM | POA: Diagnosis not present

## 2020-10-14 NOTE — Therapy (Signed)
[facility] [place], [place], [address] Phone: [phone]   Fax:  [phone]  Physical Therapy Evaluation  Patient Details  Name: Connie Rivera MRN: [id number] Date of Birth: 02-12-2001 Referring Provider (PT): Marjorie Smolder   Encounter Date: 10/14/2020   PT End of Session - 10/14/20 1429    Visit Number 1    Number of Visits 12    Date for PT Re-Evaluation 11/25/20    PT Start Time 0142    PT Stop Time 0204    PT Time Calculation (min) 22 min    Activity Tolerance Patient tolerated treatment well    Behavior During Therapy [facility] for tasks assessed/performed           Past Medical History:  Diagnosis Date  . Amenorrhea 07/10/2015  . Asthma   . Depression   . Irregular periods 07/10/2015  . Obesity   . Scoliosis   . Sleep apnea     Past Surgical History:  Procedure Laterality Date  . TONSILLECTOMY    . TONSILLECTOMY AND ADENOIDECTOMY    . TONSILLECTOMY AND ADENOIDECTOMY    . TYMPANOSTOMY TUBE PLACEMENT      There were no vitals filed for this visit.    Subjective Assessment - 10/14/20 1434    Subjective COVID-19 screen performed prior to patient entering clinic.  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  Her pain is rated at an 4/10 today but can rise to 5+/36 with certain movements of her left shoulder.  Heat decreases her pain.    Pertinent History Asthma, scoliosis.    Patient Stated Goals Get out of pain.    Currently in Pain? Yes    Pain Score 4     Pain Location Shoulder    Pain Orientation Left    Pain Descriptors / Indicators Numbness   "stinging".   Pain Type Chronic pain    Pain Onset More than a month ago    Pain Frequency Constant    Aggravating Factors  See above.    Pain Relieving Factors See above.              [facility] PT  Assessment - 10/14/20 0001      Assessment   Medical Diagnosis Chronic left shoulder pain.    Referring Provider (PT) Tiffany Lilia Pro    Onset Date/Surgical Date --   ~6 months.     Precautions   Precautions None      Restrictions   Weight Bearing Restrictions No      Balance Screen   Has the patient fallen in the past 6 months No    Has the patient had a decrease in activity level because of a fear of falling?  No    Is the patient reluctant to leave their home because of a fear of falling?  No      Home Ecologist residence      Prior Function   Level of Independence Independent      Posture/Postural Control   Posture/Postural Control Postural limitations    Postural Limitations Rounded Shoulders      Deep Tendon Reflexes   DTR Assessment Site Biceps;Brachioradialis;Triceps    Biceps DTR 2+    Brachioradialis DTR 2+    Triceps DTR 2+      ROM /  Strength   AROM / PROM / Strength AROM;Strength      AROM   Overall AROM Comments Full active left shoulder range of motion.  In fact, her left shoulder is remarkable for hypermobility.      Strength   Overall Strength Comments Left shoulder ER= 4 to 4+/5.      Palpation   Palpation comment Patient c/o some discomfort along her left UT and in region of her left acromial ridge and middle deltoid.      Special Tests   Other special tests (-) Left shoulder Impingement testing.      Ambulation/Gait   Gait Comments WNL.                      Objective measurements completed on examination: See above findings.                    PT Long Term Goals - 10/14/20 1502      PT LONG TERM GOAL #1   Title Independent with an HEP.    Baseline No knowledge of appropriate ther ex.    Time 6    Period Weeks    Status New      PT LONG TERM GOAL #2   Title Perform ADL's with pain not > 2-3/10.    Baseline Pain rises to 8+/10 with increased use of her left UE.    Time 6      Period Weeks    Status New      PT LONG TERM GOAL #3   Title Increase left shoulder ER to a 5/5.    Baseline Left shoulder ER is 4 to 4+/5.    Time 6    Period Weeks    Status New                   Patient will benefit from skilled therapeutic intervention in order to improve the following deficits and impairments:     Visit Diagnosis: Chronic left shoulder pain - Plan: PT plan of care cert/re-cert  Muscle weakness (generalized) - Plan: PT plan of care cert/re-cert     Problem List Patient Active Problem List   Diagnosis Date Noted  . Dorsal cervical fat pad 02/24/2018  . Insulin resistance 04/21/2017  . Morbid obesity ([place]) 04/14/2017  . Acanthosis nigricans, acquired 04/14/2017  . Post-traumatic headache, not intractable 11/23/2016  . GAD (generalized anxiety disorder) 10/22/2016  . Recurrent major depressive disorder ([place]) 03/04/2016  . PTSD (post-traumatic stress disorder) 02/03/2016  . Rhinitis, allergic 11/12/2015  . Irregular periods 07/10/2015  . Morbid childhood obesity with BMI greater than 99th percentile for age [facility]) 07/10/2015  . Amenorrhea 07/10/2015  . Migraine without aura and without status migrainosus, not intractable 12/19/2014  . Tension headache 12/19/2014    Natascha Edmonds, [place] MPT 10/14/2020, 3:06 PM  [facility] [address] [place], [place], [address] Phone: [phone]   Fax:  [phone]  Name: Connie Rivera MRN: [id number] Date of Birth: July 25, 2001

## 2020-11-13 ENCOUNTER — Ambulatory Visit: Payer: Medicaid Other | Admitting: Physical Therapy

## 2020-11-26 ENCOUNTER — Ambulatory Visit (INDEPENDENT_AMBULATORY_CARE_PROVIDER_SITE_OTHER): Payer: Medicaid Other | Admitting: Nurse Practitioner

## 2020-11-26 DIAGNOSIS — Z20822 Contact with and (suspected) exposure to covid-19: Secondary | ICD-10-CM | POA: Diagnosis not present

## 2020-11-26 DIAGNOSIS — E876 Hypokalemia: Secondary | ICD-10-CM | POA: Diagnosis not present

## 2020-11-26 DIAGNOSIS — J180 Bronchopneumonia, unspecified organism: Secondary | ICD-10-CM | POA: Diagnosis not present

## 2020-11-26 DIAGNOSIS — Z6841 Body Mass Index (BMI) 40.0 and over, adult: Secondary | ICD-10-CM | POA: Diagnosis not present

## 2020-11-26 DIAGNOSIS — R059 Cough, unspecified: Secondary | ICD-10-CM | POA: Diagnosis not present

## 2020-11-26 DIAGNOSIS — J189 Pneumonia, unspecified organism: Secondary | ICD-10-CM | POA: Diagnosis not present

## 2020-11-26 DIAGNOSIS — Z91199 Patient's noncompliance with other medical treatment and regimen due to unspecified reason: Secondary | ICD-10-CM

## 2020-11-26 DIAGNOSIS — Z91040 Latex allergy status: Secondary | ICD-10-CM | POA: Diagnosis not present

## 2020-11-26 DIAGNOSIS — Z5329 Procedure and treatment not carried out because of patient's decision for other reasons: Secondary | ICD-10-CM

## 2020-11-26 DIAGNOSIS — R0602 Shortness of breath: Secondary | ICD-10-CM | POA: Diagnosis not present

## 2020-11-26 DIAGNOSIS — J45909 Unspecified asthma, uncomplicated: Secondary | ICD-10-CM | POA: Diagnosis not present

## 2020-11-26 DIAGNOSIS — J9601 Acute respiratory failure with hypoxia: Secondary | ICD-10-CM | POA: Diagnosis not present

## 2020-11-26 NOTE — Progress Notes (Signed)
Patient ID: Connie Rivera, female   DOB: 07/13/2001, 19 y.o.   MRN: [id number]   Attempted to call patient 3 x between 11:45 and 12:15. Calls wnet directly to voice mail. Will need  To make another rappointment.

## 2020-11-27 DIAGNOSIS — J9601 Acute respiratory failure with hypoxia: Secondary | ICD-10-CM | POA: Diagnosis not present

## 2020-11-27 DIAGNOSIS — J180 Bronchopneumonia, unspecified organism: Secondary | ICD-10-CM | POA: Diagnosis not present

## 2020-11-28 DIAGNOSIS — J180 Bronchopneumonia, unspecified organism: Secondary | ICD-10-CM | POA: Diagnosis not present

## 2020-11-29 ENCOUNTER — Telehealth: Payer: Self-pay

## 2020-11-29 NOTE — Telephone Encounter (Signed)
Transition Care Management Follow-up Telephone Call  Date of discharge and from where: 11/28/2020 [facility]  How have you been since you were released from the hospital? Needs to get retested for COVID, but is feeling okay.   Any questions or concerns? No  Items Reviewed:  Did the pt receive and understand the discharge instructions provided? Yes   Medications obtained and verified? Yes   Other? No   Any new allergies since your discharge? Yes   Dietary orders reviewed? No  Do you have support at home? Yes    Functional Questionnaire: (I = Independent and D = Dependent) ADLs: I  Bathing/Dressing- i  Meal Prep- I  Eating- I  Maintaining continence- I  Transferring/Ambulation- I  Managing Meds- I  Follow up appointments reviewed:   PCP Hospital f/u appt confirmed? No  Patient stated she will follow up with [facility] after she get COVID results.   [facility] f/u appt confirmed?   Are transportation arrangements needed? No   If their condition worsens, is the pt aware to call PCP or go to the Emergency Dept.? Yes  Was the patient provided with contact information for the PCP's office or ED? Yes  Was to pt encouraged to call back with questions or concerns? Yes

## 2020-12-02 ENCOUNTER — Telehealth: Payer: Self-pay

## 2020-12-02 NOTE — Telephone Encounter (Signed)
Appointment scheduled with Connie Rivera on 12/06/20 at 8:40 am for video visit.

## 2020-12-06 ENCOUNTER — Telehealth (INDEPENDENT_AMBULATORY_CARE_PROVIDER_SITE_OTHER): Payer: Medicaid Other | Admitting: Family

## 2020-12-06 ENCOUNTER — Ambulatory Visit (INDEPENDENT_AMBULATORY_CARE_PROVIDER_SITE_OTHER): Payer: Medicaid Other

## 2020-12-06 ENCOUNTER — Encounter: Payer: Self-pay | Admitting: Family

## 2020-12-06 DIAGNOSIS — R059 Cough, unspecified: Secondary | ICD-10-CM

## 2020-12-06 DIAGNOSIS — J180 Bronchopneumonia, unspecified organism: Secondary | ICD-10-CM

## 2020-12-06 DIAGNOSIS — E876 Hypokalemia: Secondary | ICD-10-CM | POA: Diagnosis not present

## 2020-12-06 DIAGNOSIS — Z09 Encounter for follow-up examination after completed treatment for conditions other than malignant neoplasm: Secondary | ICD-10-CM | POA: Diagnosis not present

## 2020-12-06 DIAGNOSIS — J189 Pneumonia, unspecified organism: Secondary | ICD-10-CM | POA: Diagnosis not present

## 2020-12-06 LAB — CBC WITH DIFFERENTIAL/PLATELET
Basophils Absolute: 0.1 10*3/uL (ref 0.0–0.2)
Basos: 1 %
EOS (ABSOLUTE): 0.3 10*3/uL (ref 0.0–0.4)
Eos: 3 %
Hematocrit: 48.2 % — ABNORMAL HIGH (ref 34.0–46.6)
Hemoglobin: 16.5 g/dL — ABNORMAL HIGH (ref 11.1–15.9)
Immature Grans (Abs): 0.1 10*3/uL (ref 0.0–0.1)
Immature Granulocytes: 1 %
Lymphocytes Absolute: 4.3 10*3/uL — ABNORMAL HIGH (ref 0.7–3.1)
Lymphs: 35 %
MCH: 30.2 pg (ref 26.6–33.0)
MCHC: 34.2 g/dL (ref 31.5–35.7)
MCV: 88 fL (ref 79–97)
Monocytes Absolute: 1.1 10*3/uL — ABNORMAL HIGH (ref 0.1–0.9)
Monocytes: 9 %
Neutrophils Absolute: 6.6 10*3/uL (ref 1.4–7.0)
Neutrophils: 51 %
Platelets: 270 10*3/uL (ref 150–450)
RBC: 5.47 x10E6/uL — ABNORMAL HIGH (ref 3.77–5.28)
RDW: 12 % (ref 11.7–15.4)
WBC: 12.5 10*3/uL — ABNORMAL HIGH (ref 3.4–10.8)

## 2020-12-06 LAB — CMP14+EGFR
ALT: 26 IU/L (ref 0–32)
AST: 19 IU/L (ref 0–40)
Albumin/Globulin Ratio: 2.1 (ref 1.2–2.2)
Albumin: 4.1 g/dL (ref 3.9–5.0)
Alkaline Phosphatase: 76 IU/L (ref 42–106)
BUN/Creatinine Ratio: 17 (ref 9–23)
BUN: 15 mg/dL (ref 6–20)
Bilirubin Total: 0.3 mg/dL (ref 0.0–1.2)
CO2: 26 mmol/L (ref 20–29)
Calcium: 9.5 mg/dL (ref 8.7–10.2)
Chloride: 105 mmol/L (ref 96–106)
Creatinine, Ser: 0.89 mg/dL (ref 0.57–1.00)
GFR calc Af Amer: 108 mL/min/{1.73_m2} (ref >59)
GFR calc non Af Amer: 94 mL/min/{1.73_m2} (ref >59)
Globulin, Total: 2 g/dL (ref 1.5–4.5)
Glucose: 79 mg/dL (ref 65–99)
Potassium: 4.1 mmol/L (ref 3.5–5.2)
Sodium: 146 mmol/L — ABNORMAL HIGH (ref 134–144)
Total Protein: 6.1 g/dL (ref 6.0–8.5)

## 2020-12-06 MED ORDER — BENZONATATE 200 MG PO CAPS
200.0000 mg | ORAL_CAPSULE | Freq: Three times a day (TID) | ORAL | 1 refills | Status: DC | PRN
Start: 2020-12-06 — End: 2021-02-06

## 2020-12-06 NOTE — Addendum Note (Signed)
Addended by: Liliane Bade on: 12/06/2020 10:38 AM   Modules accepted: Orders

## 2020-12-06 NOTE — Progress Notes (Signed)
Virtual Visit via telephone Note Due to COVID-19 pandemic this visit was conducted virtually. This visit type was conducted due to national recommendations for restrictions regarding the COVID-19 Pandemic (e.g. social distancing, sheltering in place) in an effort to limit this patient's exposure and mitigate transmission in our community. All issues noted in this document were discussed and addressed.  A physical exam was not performed with this format.  I connected with Connie Rivera on 12/06/20 at 8:54 AM  by telephone and verified that I am speaking with the correct person using two identifiers. Connie Rivera is currently located at car and father-in-law is currently with her during visit. The provider, [name], FNP is located in their office at time of visit.  I discussed the limitations, risks, security and privacy concerns of performing an evaluation and management service by telephone and the availability of in person appointments. I also discussed with the patient that there may be a patient responsible charge related to this service. The patient expressed understanding and agreed to proceed.   History and Present Illness:  Pt calls the office today for hospital follow up. She went to the ED on 11/26/20 and was admitted with positive rhinovirus/enterovirus, bronchitis with infrahilar atelectasis/bronchopneumonia. She was discharged on  11/28/20 with doxycycline, prednisone, and albuterol. She was COVID negative.  Cough This is a new problem. The current episode started 1 to 4 weeks ago. The problem has been rapidly improving. The problem occurs every few minutes. The cough is non-productive. Associated symptoms include wheezing. Pertinent negatives include no ear congestion, ear pain, fever, headaches, nasal congestion, postnasal drip, sore throat or shortness of breath. She has tried oral steroids and OTC inhaler for the symptoms. The treatment provided mild relief.      Review of  Systems  Constitutional: Negative for fever.  HENT: Negative for ear pain, postnasal drip and sore throat.   Respiratory: Positive for cough and wheezing. Negative for shortness of breath.   Neurological: Negative for headaches.  All other systems reviewed and are negative.    Observations/Objective: No SOB or distress noted, hoarse voice   Assessment and Plan: Connie Rivera comes in today with chief complaint of No chief complaint on file.   Diagnosis and orders addressed:  1. Hospital discharge follow-up - CMP14+EGFR; Future - CBC with Differential/Platelet; Future - DG Chest 2 View; Future  2. Bronchopneumonia - CMP14+EGFR; Future - CBC with Differential/Platelet; Future - benzonatate (TESSALON) 200 MG capsule; Take 1 capsule (200 mg total) by mouth 3 (three) times daily as needed.  Dispense: 90 capsule; Refill: 1  3. Cough - CMP14+EGFR; Future - CBC with Differential/Platelet; Future - benzonatate (TESSALON) 200 MG capsule; Take 1 capsule (200 mg total) by mouth 3 (three) times daily as needed.  Dispense: 90 capsule; Refill: 1  4. Hypokalemia - CMP14+EGFR; Future - CBC with Differential/Platelet; Future  Labs and X-ray  pending Diet and exercise encouraged RTO if symptoms worsen or do not improve       I discussed the assessment and treatment plan with the patient. The patient was provided an opportunity to ask questions and all were answered. The patient agreed with the plan and demonstrated an understanding of the instructions.   The patient was advised to call back or seek an in-person evaluation if the symptoms worsen or if the condition fails to improve as anticipated.  The above assessment and management plan was discussed with the patient. The patient verbalized understanding of and has  agreed to the management plan. Patient is aware to call the clinic if symptoms persist or worsen. Patient is aware when to return to the clinic for a follow-up visit. Patient  educated on when it is appropriate to go to the emergency department.   Time call ended:  9:17 AM   I provided 23 minutes of non-face-to-face time during this encounter.    [name], FNP

## 2020-12-09 ENCOUNTER — Other Ambulatory Visit: Payer: Self-pay | Admitting: Family

## 2020-12-09 MED ORDER — DOXYCYCLINE HYCLATE 100 MG PO TABS: 100.0000 mg | ORAL_TABLET | Freq: Two times a day (BID) | ORAL | 0 refills | Status: DC

## 2021-01-15 ENCOUNTER — Ambulatory Visit: Payer: Medicaid Other

## 2021-02-06 ENCOUNTER — Ambulatory Visit (INDEPENDENT_AMBULATORY_CARE_PROVIDER_SITE_OTHER): Payer: Medicaid Other | Admitting: Family Medicine

## 2021-02-06 ENCOUNTER — Encounter: Payer: Self-pay | Admitting: Family Medicine

## 2021-02-06 ENCOUNTER — Telehealth: Payer: Self-pay

## 2021-02-06 ENCOUNTER — Other Ambulatory Visit: Payer: Self-pay

## 2021-02-06 VITALS — BP 128/70 | HR 74 | Temp 96.8°F | Ht 71.45 in | Wt 380.1 lb

## 2021-02-06 DIAGNOSIS — N912 Amenorrhea, unspecified: Secondary | ICD-10-CM | POA: Diagnosis not present

## 2021-02-06 LAB — PREGNANCY, URINE: Preg Test, Ur: NEGATIVE

## 2021-02-06 NOTE — Telephone Encounter (Signed)
Waiting on other provider to reply.

## 2021-02-06 NOTE — Telephone Encounter (Signed)
This is fine 

## 2021-02-06 NOTE — Progress Notes (Signed)
Acute Office Visit  Subjective:    Patient ID: Connie Rivera, female    DOB: 2001/09/03, 20 y.o.   MRN: [id number]  Chief Complaint  Patient presents with  . Amenorrhea    HPI Patient is in today for amenorrhea. Her last LMP was 12/25/20. She does have a history of irregular cycles however after taking Provera in October, her cycles have been very regular. She had a positive urine pregnancy test at home 3 days ago. She has been feeling a little nauseous.    Past Medical History:  Diagnosis Date  . Amenorrhea 07/10/2015  . Asthma   . Depression   . Irregular periods 07/10/2015  . Obesity   . Scoliosis   . Sleep apnea     Past Surgical History:  Procedure Laterality Date  . TONSILLECTOMY    . TONSILLECTOMY AND ADENOIDECTOMY    . TONSILLECTOMY AND ADENOIDECTOMY    . TYMPANOSTOMY TUBE PLACEMENT      Family History  Problem Relation Age of Onset  . Hyperlipidemia Father   . Hypertension Father   . Migraines Mother   . Febrile seizures Sister        Resolved  . ADD / ADHD Sister   . Anxiety disorder Sister   . Bipolar disorder Sister   . Migraines Maternal Grandmother   . Diabetes Maternal Grandfather   . Kidney disease Maternal Grandfather   . Hypertension Maternal Grandfather   . Hyperlipidemia Maternal Grandfather   . Heart murmur Maternal Grandfather   . Rheumatic fever Maternal Grandfather   . Cancer Paternal Grandmother        breast  . COPD Paternal Grandmother   . Emphysema Paternal Grandmother   . Congestive Heart Failure Paternal Grandmother   . Cancer Paternal Grandfather   . Heart attack Neg Hx   . Sudden death Neg Hx     Social History   Socioeconomic History  . Marital status: Single    Spouse name: Not on file  . Number of children: Not on file  . Years of education: Not on file  . Highest education level: Not on file  Occupational History  . Not on file  Tobacco Use  . Smoking status: Light Tobacco Smoker    Types: Cigarettes  .  Smokeless tobacco: Never Used  . Tobacco comment: Patient is vaping off and on.  Not daily  Vaping Use  . Vaping Use: Never used  Substance and Sexual Activity  . Alcohol use: No  . Drug use: No  . Sexual activity: Yes    Birth control/protection: Condom, Implant  Other Topics Concern  . 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   She attends [employer].   She lives with both parents and her cousin.   She enjoys basketball, hanging with friends, and reading.   Social Determinants of Health   Financial Resource Strain: Not on file  Food Insecurity: Not on file  Transportation Needs: Not on file  Physical Activity: Not on file  Stress: Not on file  Social Connections: Not on file  Intimate Partner Violence: Not on file    Outpatient Medications Prior to Visit  Medication Sig Dispense Refill  . Nutritional Supplements (KETO PO) Take by mouth. (Patient not taking: Reported on 02/06/2021)    . Orlistat (ALLI PO) Take by mouth. (Patient not taking: Reported on 02/06/2021)    . PROAIR HFA 108 (90 Base) MCG/ACT inhaler Inhale  into the lungs. (Patient not taking: Reported on 02/06/2021)    . benzonatate (TESSALON) 200 MG capsule Take 1 capsule (200 mg total) by mouth 3 (three) times daily as needed. 90 capsule 1  . doxycycline (VIBRA-TABS) 100 MG tablet Take 1 tablet (100 mg total) by mouth 2 (two) times daily. 20 tablet 0  . medroxyPROGESTERone (PROVERA) 10 MG tablet Take 1 tablet (10 mg total) by mouth daily. 10 tablet 0   No facility-administered medications prior to visit.    Allergies  Allergen Reactions  . Pineapple Anaphylaxis and Itching  . Adhesive [Tape] Rash  . Latex Rash    Review of Systems As per HPI.     Objective:    Physical Exam Vitals and nursing note reviewed.  Constitutional:      General: She is not in acute distress.    Appearance: She is not ill-appearing, toxic-appearing or diaphoretic.  Cardiovascular:      Rate and Rhythm: Normal rate and regular rhythm.     Heart sounds: Normal heart sounds. No murmur heard.   Pulmonary:     Effort: Pulmonary effort is normal. No respiratory distress.     Breath sounds: Normal breath sounds.  Skin:    General: Skin is warm and dry.  Neurological:     General: No focal deficit present.     Mental Status: She is alert and oriented to person, place, and time.  Psychiatric:        Mood and Affect: Mood normal.        Behavior: Behavior normal.        Thought Content: Thought content normal.        Judgment: Judgment normal.     BP 128/70   Pulse 74   Temp (!) 96.8 F (36 C) (Temporal)   Ht 5' 11.45" (1.815 m)   Wt (!) 380 lb 2 oz (172.4 kg)   BMI 52.35 kg/m  Wt Readings from Last 3 Encounters:  02/06/21 (!) 380 lb 2 oz (172.4 kg)  10/07/20 (!) 362 lb 6.4 oz (164.4 kg) (>99 %, Z= 3.14)*  07/25/20 (!) 361 lb 6.4 oz (163.9 kg) (>99 %, Z= 3.11)*   * Growth percentiles are based on CDC (Girls, 2-20 Years) data.    There are no preventive care reminders to display for this patient.  There are no preventive care reminders to display for this patient.   Lab Results  Component Value Date   TSH 0.898 08/12/2015   Lab Results  Component Value Date   WBC 12.5 (H) 12/06/2020   HGB 16.5 (H) 12/06/2020   HCT 48.2 (H) 12/06/2020   MCV 88 12/06/2020   PLT 270 12/06/2020   Lab Results  Component Value Date   NA 146 (H) 12/06/2020   K 4.1 12/06/2020   CO2 26 12/06/2020   GLUCOSE 79 12/06/2020   BUN 15 12/06/2020   CREATININE 0.89 12/06/2020   BILITOT 0.3 12/06/2020   ALKPHOS 76 12/06/2020   AST 19 12/06/2020   ALT 26 12/06/2020   PROT 6.1 12/06/2020   ALBUMIN 4.1 12/06/2020   CALCIUM 9.5 12/06/2020   ANIONGAP 6 08/01/2015   Lab Results  Component Value Date   CHOL 109 04/21/2017   Lab Results  Component Value Date   HDL 29 (L) 04/21/2017   Lab Results  Component Value Date   LDLCALC 65 04/21/2017   Lab Results  Component  Value Date   TRIG 76 04/21/2017   Lab Results  Component  Value Date   CHOLHDL 3.8 04/21/2017   Lab Results  Component Value Date   HGBA1C 5.1 04/21/2017       Assessment & Plan:   Connie Rivera was seen today for amenorrhea.  Diagnoses and all orders for this visit:  Amenorrhea Negative urine pregnancy test in the office. Today. History of irregular cycles. HCG quant pending.  -     Pregnancy, urine -     Beta hCG quant (ref lab)   Follow up as needed. Discussed with patient that she should schedule a CPE with her PCP.   The patient indicates understanding of these issues and agrees with the plan.   Gwenlyn Perking, FNP

## 2021-02-06 NOTE — Telephone Encounter (Signed)
Called patient lmtcb - changed PCP in chart

## 2021-02-06 NOTE — Telephone Encounter (Signed)
This is fine with me   

## 2021-02-07 LAB — BETA HCG QUANT (REF LAB): hCG Quant: <1 m[IU]/mL

## 2021-05-27 DIAGNOSIS — N979 Female infertility, unspecified: Secondary | ICD-10-CM | POA: Diagnosis not present

## 2021-05-27 DIAGNOSIS — F17200 Nicotine dependence, unspecified, uncomplicated: Secondary | ICD-10-CM | POA: Diagnosis not present

## 2021-05-27 DIAGNOSIS — L680 Hirsutism: Secondary | ICD-10-CM | POA: Diagnosis not present

## 2021-05-27 DIAGNOSIS — E282 Polycystic ovarian syndrome: Secondary | ICD-10-CM | POA: Diagnosis not present

## 2021-05-27 DIAGNOSIS — N926 Irregular menstruation, unspecified: Secondary | ICD-10-CM | POA: Diagnosis not present

## 2021-05-27 DIAGNOSIS — R1902 Left upper quadrant abdominal swelling, mass and lump: Secondary | ICD-10-CM | POA: Diagnosis not present

## 2021-07-07 DIAGNOSIS — N970 Female infertility associated with anovulation: Secondary | ICD-10-CM | POA: Diagnosis not present

## 2021-07-07 DIAGNOSIS — N914 Secondary oligomenorrhea: Secondary | ICD-10-CM | POA: Diagnosis not present

## 2021-07-07 DIAGNOSIS — R222 Localized swelling, mass and lump, trunk: Secondary | ICD-10-CM | POA: Diagnosis not present

## 2021-07-07 DIAGNOSIS — R7309 Other abnormal glucose: Secondary | ICD-10-CM | POA: Diagnosis not present

## 2021-07-07 DIAGNOSIS — E282 Polycystic ovarian syndrome: Secondary | ICD-10-CM | POA: Diagnosis not present

## 2021-07-07 DIAGNOSIS — Z6841 Body Mass Index (BMI) 40.0 and over, adult: Secondary | ICD-10-CM | POA: Diagnosis not present

## 2021-07-15 DIAGNOSIS — U071 COVID-19: Secondary | ICD-10-CM | POA: Diagnosis not present

## 2021-07-15 DIAGNOSIS — F17200 Nicotine dependence, unspecified, uncomplicated: Secondary | ICD-10-CM | POA: Diagnosis not present

## 2021-07-17 ENCOUNTER — Telehealth: Payer: Self-pay

## 2021-07-17 NOTE — Telephone Encounter (Signed)
Transition Care Management Follow-up Telephone Call Date of discharge and from where: 07/16/2021[facility]  How have you been since you were released from the hospital? Patient stated she is doing fine.  Any questions or concerns? No  Items Reviewed: Did the pt receive and understand the discharge instructions provided? Yes  Medications obtained and verified? Yes  Other? No  Any new allergies since your discharge? No  Dietary orders reviewed? N/A Do you have support at home? Yes   Home Care and Equipment/Supplies: Were home health services ordered? not applicable If so, what is the name of the agency? N/A  Has the agency set up a time to come to the patient's home? not applicable Were any new equipment or medical supplies ordered?  No What is the name of the medical supply agency? N/A Were you able to get the supplies/equipment? not applicable Do you have any questions related to the use of the equipment or supplies? No  Functional Questionnaire: (I = Independent and D = Dependent) ADLs: I  Bathing/Dressing- I  Meal Prep- I  Eating- I  Maintaining continence- I  Transferring/Ambulation- I  Managing Meds- I  Follow up appointments reviewed:  PCP Hospital f/u appt confirmed? No   Specialist Hospital f/u appt confirmed? No   Are transportation arrangements needed? No  If their condition worsens, is the pt aware to call PCP or go to the Emergency Dept.? Yes Was the patient provided with contact information for the PCP's office or ED? Yes Was to pt encouraged to call back with questions or concerns? Yes

## 2021-07-29 DIAGNOSIS — R222 Localized swelling, mass and lump, trunk: Secondary | ICD-10-CM | POA: Diagnosis not present

## 2021-09-15 ENCOUNTER — Ambulatory Visit (INDEPENDENT_AMBULATORY_CARE_PROVIDER_SITE_OTHER): Payer: Medicaid Other | Admitting: Family Medicine

## 2021-09-15 ENCOUNTER — Encounter: Payer: Self-pay | Admitting: Family Medicine

## 2021-09-15 DIAGNOSIS — J069 Acute upper respiratory infection, unspecified: Secondary | ICD-10-CM | POA: Diagnosis not present

## 2021-09-15 NOTE — Progress Notes (Signed)
   Virtual Visit  Note Due to COVID-19 pandemic this visit was conducted virtually. This visit type was conducted due to national recommendations for restrictions regarding the COVID-19 Pandemic (e.g. social distancing, sheltering in place) in an effort to limit this patient's exposure and mitigate transmission in our community. All issues noted in this document were discussed and addressed.  A physical exam was not performed with this format.  I connected with Connie Rivera on 09/15/21 at [phone] by telephone and verified that I am speaking with the correct person using two identifiers. Connie Rivera is currently located at home and no one is currently with her during the visit. The provider, Gwenlyn Perking, FNP is located in their office at time of visit.  I discussed the limitations, risks, security and privacy concerns of performing an evaluation and management service by telephone and the availability of in person appointments. I also discussed with the patient that there may be a patient responsible charge related to this service. The patient expressed understanding and agreed to proceed.  CC: URI  History and Present Illness:  HPI Connie Rivera reports URI symptoms x 2 days. She reports congestion, runny nose, sore throat, cough, chills, HA, and diarrhea. She has been around her nieces and nephews who were just diagnosed with URIs. She has had 2 negative home Covid tests. She plans to take another tomorrow. She reports a Covid infection 1-2 months ago. Denies fever, chest pain, vomiting, or shortness of breath. She has not taken anything for her symptoms.     ROS As per HPI.   Observations/Objective: Alert and oriented x 3. Able to speak in full sentences without difficulty.   Assessment and Plan: Clio was seen today for uri.  Diagnoses and all orders for this visit:  Viral URI Declined PCR Covid test and rapid flu today. Discussed symptomatic care for viral illness. Discussed return  precautions.    Follow Up Instructions: Return to office for new or worsening symptoms, or if symptoms persist.     I discussed the assessment and treatment plan with the patient. The patient was provided an opportunity to ask questions and all were answered. The patient agreed with the plan and demonstrated an understanding of the instructions.   The patient was advised to call back or seek an in-person evaluation if the symptoms worsen or if the condition fails to improve as anticipated.  The above assessment and management plan was discussed with the patient. The patient verbalized understanding of and has agreed to the management plan. Patient is aware to call the clinic if symptoms persist or worsen. Patient is aware when to return to the clinic for a follow-up visit. Patient educated on when it is appropriate to go to the emergency department.   Time call ended:  0830  I provided 11 minutes of  non face-to-face time during this encounter.    Gwenlyn Perking, FNP

## 2021-09-18 DIAGNOSIS — Z720 Tobacco use: Secondary | ICD-10-CM | POA: Diagnosis not present

## 2021-09-18 DIAGNOSIS — Z91040 Latex allergy status: Secondary | ICD-10-CM | POA: Diagnosis not present

## 2021-09-18 DIAGNOSIS — J069 Acute upper respiratory infection, unspecified: Secondary | ICD-10-CM | POA: Diagnosis not present

## 2021-09-18 DIAGNOSIS — B349 Viral infection, unspecified: Secondary | ICD-10-CM | POA: Diagnosis not present

## 2021-09-18 DIAGNOSIS — Z20822 Contact with and (suspected) exposure to covid-19: Secondary | ICD-10-CM | POA: Diagnosis not present

## 2021-09-19 ENCOUNTER — Telehealth: Payer: Self-pay | Admitting: *Deleted

## 2021-09-19 NOTE — Telephone Encounter (Signed)
Transition Care Management Follow-up Telephone Call Date of discharge and from where: 09/18/2021 [facility] ED How have you been since you were released from the hospital? "I am fine" Any questions or concerns? No  Items Reviewed: Did the pt receive and understand the discharge instructions provided? Yes  Medications obtained and verified? Yes Other? No  Any new allergies since your discharge? No  Dietary orders reviewed? No Do you have support at home? Yes    Functional Questionnaire: (I = Independent and D = Dependent) ADLs: I  Bathing/Dressing- I  Meal Prep- I  Eating- I  Maintaining continence- I  Transferring/Ambulation- I  Managing Meds- I  Follow up appointments reviewed:  PCP Hospital f/u appt confirmed? No   Specialist Hospital f/u appt confirmed? No   Are transportation arrangements needed? No  If their condition worsens, is the pt aware to call PCP or go to the Emergency Dept.? Yes Was the patient provided with contact information for the PCP's office or ED? Yes Was to pt encouraged to call back with questions or concerns? Yes

## 2021-10-07 DIAGNOSIS — R1909 Other intra-abdominal and pelvic swelling, mass and lump: Secondary | ICD-10-CM | POA: Diagnosis not present

## 2021-10-07 DIAGNOSIS — F17210 Nicotine dependence, cigarettes, uncomplicated: Secondary | ICD-10-CM | POA: Diagnosis not present

## 2021-10-07 DIAGNOSIS — J45909 Unspecified asthma, uncomplicated: Secondary | ICD-10-CM | POA: Diagnosis not present

## 2021-10-07 DIAGNOSIS — R222 Localized swelling, mass and lump, trunk: Secondary | ICD-10-CM | POA: Diagnosis not present

## 2021-10-07 DIAGNOSIS — M7989 Other specified soft tissue disorders: Secondary | ICD-10-CM | POA: Diagnosis not present

## 2021-10-07 DIAGNOSIS — F32A Depression, unspecified: Secondary | ICD-10-CM | POA: Diagnosis not present

## 2021-10-07 DIAGNOSIS — K219 Gastro-esophageal reflux disease without esophagitis: Secondary | ICD-10-CM | POA: Diagnosis not present

## 2021-10-13 DIAGNOSIS — J00 Acute nasopharyngitis [common cold]: Secondary | ICD-10-CM | POA: Diagnosis not present

## 2021-10-13 DIAGNOSIS — Z03818 Encounter for observation for suspected exposure to other biological agents ruled out: Secondary | ICD-10-CM | POA: Diagnosis not present

## 2021-10-15 ENCOUNTER — Telehealth: Payer: Medicaid Other | Admitting: Family Medicine

## 2021-10-15 ENCOUNTER — Ambulatory Visit: Payer: Medicaid Other | Admitting: Family Medicine

## 2021-10-15 ENCOUNTER — Telehealth: Payer: Self-pay | Admitting: Family Medicine

## 2021-10-15 DIAGNOSIS — J329 Chronic sinusitis, unspecified: Secondary | ICD-10-CM | POA: Diagnosis not present

## 2021-10-15 NOTE — Telephone Encounter (Signed)
Pt called stating that somebody scheduled her to be seen today in our covid clinic for cough and sore throat.  Pt said she was looking on her [facility] My Chart and saw that they have some kind of Artery disorder listed in her medical history and she wanted to know if she could get that checked while she was here today.  Explained to pt that she would need another appt for that because what she was being seen for today was for acute sick visit.  Pt got upset and stated that she would just cancel her appt and go to the ER to have everything checked in one visit there and then hung up the phone.

## 2021-11-15 DIAGNOSIS — L7682 Other postprocedural complications of skin and subcutaneous tissue: Secondary | ICD-10-CM | POA: Diagnosis not present

## 2021-11-15 DIAGNOSIS — R1012 Left upper quadrant pain: Secondary | ICD-10-CM | POA: Diagnosis not present

## 2021-11-15 DIAGNOSIS — Z79899 Other long term (current) drug therapy: Secondary | ICD-10-CM | POA: Diagnosis not present

## 2021-11-15 DIAGNOSIS — F17210 Nicotine dependence, cigarettes, uncomplicated: Secondary | ICD-10-CM | POA: Diagnosis not present

## 2021-11-15 DIAGNOSIS — Z91018 Allergy to other foods: Secondary | ICD-10-CM | POA: Diagnosis not present

## 2021-11-15 DIAGNOSIS — Z885 Allergy status to narcotic agent status: Secondary | ICD-10-CM | POA: Diagnosis not present

## 2021-11-15 DIAGNOSIS — Z91048 Other nonmedicinal substance allergy status: Secondary | ICD-10-CM | POA: Diagnosis not present

## 2021-11-15 DIAGNOSIS — Z9889 Other specified postprocedural states: Secondary | ICD-10-CM | POA: Diagnosis not present

## 2021-11-15 DIAGNOSIS — R935 Abnormal findings on diagnostic imaging of other abdominal regions, including retroperitoneum: Secondary | ICD-10-CM | POA: Diagnosis not present

## 2021-11-15 DIAGNOSIS — Z793 Long term (current) use of hormonal contraceptives: Secondary | ICD-10-CM | POA: Diagnosis not present

## 2021-11-15 DIAGNOSIS — Z91040 Latex allergy status: Secondary | ICD-10-CM | POA: Diagnosis not present

## 2021-11-15 DIAGNOSIS — L7634 Postprocedural seroma of skin and subcutaneous tissue following other procedure: Secondary | ICD-10-CM | POA: Diagnosis not present

## 2021-11-15 DIAGNOSIS — Z888 Allergy status to other drugs, medicaments and biological substances status: Secondary | ICD-10-CM | POA: Diagnosis not present

## 2021-11-15 DIAGNOSIS — Z8719 Personal history of other diseases of the digestive system: Secondary | ICD-10-CM | POA: Diagnosis not present

## 2021-11-15 DIAGNOSIS — Z8616 Personal history of COVID-19: Secondary | ICD-10-CM | POA: Diagnosis not present

## 2021-11-15 DIAGNOSIS — Z7984 Long term (current) use of oral hypoglycemic drugs: Secondary | ICD-10-CM | POA: Diagnosis not present

## 2021-11-19 ENCOUNTER — Ambulatory Visit: Payer: Medicaid Other | Admitting: Family Medicine

## 2021-11-19 ENCOUNTER — Encounter: Payer: Self-pay | Admitting: Family Medicine

## 2021-11-19 VITALS — BP 121/73 | HR 70 | Temp 97.1°F | Ht 71.45 in | Wt 350.5 lb

## 2021-11-19 DIAGNOSIS — R110 Nausea: Secondary | ICD-10-CM

## 2021-11-19 DIAGNOSIS — Z20828 Contact with and (suspected) exposure to other viral communicable diseases: Secondary | ICD-10-CM

## 2021-11-19 DIAGNOSIS — R6883 Chills (without fever): Secondary | ICD-10-CM

## 2021-11-19 LAB — VERITOR FLU A/B WAIVED
Influenza A: NEGATIVE
Influenza B: NEGATIVE

## 2021-11-19 MED ORDER — ONDANSETRON HCL 8 MG PO TABS: 8.0000 mg | ORAL_TABLET | Freq: Three times a day (TID) | ORAL | 0 refills | Status: DC | PRN

## 2021-11-19 NOTE — Patient Instructions (Signed)
Nausea, Adult Nausea is the feeling of having an upset stomach or that you are about to vomit. Nausea on its own is not usually a serious concern, but it may be an early sign of a more serious medical problem. As nausea gets worse, it can lead to vomiting. If vomiting develops, or if you are not able to drink enough fluids, you are at risk of becoming dehydrated. Dehydration can make you tired and thirsty, cause you to have a dry mouth, and decrease how often you urinate. Older adults and people with other diseases or a weak disease-fighting system (immune system) are at higher risk for dehydration. The main goals of treating your nausea are: To relieve your nausea. To limit repeated nausea episodes. To prevent vomiting and dehydration. Follow these instructions at home: Watch your symptoms for any changes. Tell your health care provider about them. Eating and drinking   Take an oral rehydration solution (ORS). This is a drink that is sold at pharmacies and retail stores. Drink clear fluids slowly and in small amounts as you are able. Clear fluids include water, ice chips, low-calorie sports drinks, and fruit juice that has water added (diluted fruit juice). Eat bland, easy-to-digest foods in small amounts as you are able. These foods include bananas, applesauce, rice, lean meats, toast, and crackers. Avoid drinking fluids that contain a lot of sugar or caffeine, such as energy drinks, sports drinks, and soda. Avoid alcohol. Avoid spicy or fatty foods. General instructions Take over-the-counter and prescription medicines only as told by your health care provider. Rest at home while you recover. Drink enough fluid to keep your urine pale yellow. Breathe slowly and deeply when you feel nauseous. Avoid smelling things that have strong odors. Wash your hands often using soap and water for at least 20 seconds. If soap and water are not available, use hand sanitizer. Make sure that everyone in your  household washes their hands well and often. Keep all follow-up visits. This is important. Contact a health care provider if: Your nausea gets worse. Your nausea does not go away after two days. You vomit multiple times. You cannot drink fluids without vomiting. You have any of the following: New symptoms. A fever. A headache. Muscle cramps. A rash. Pain while urinating. You feel light-headed or dizzy. Get help right away if: You have pain in your chest, neck, arm, or jaw. You feel extremely weak or you faint. You have vomit that is bright red or looks like coffee grounds. You have bloody or black stools (feces) or stools that look like tar. You have a severe headache, a stiff neck, or both. You have severe pain, cramping, or bloating in your abdomen. You have difficulty breathing or are breathing very quickly. Your heart is beating very quickly. Your skin feels cold and clammy. You feel confused. You have signs of dehydration, such as: Dark urine, very little urine, or no urine. Cracked lips. Dry mouth. Sunken eyes. Sleepiness. Weakness. These symptoms may be an emergency. Get help right away. Call 911. Do not wait to see if the symptoms will go away. Do not drive yourself to the hospital. Summary Nausea is the feeling that you have an upset stomach or that you are about to vomit. Nausea on its own is not usually a serious concern, but it may be an early sign of a more serious medical problem. If vomiting develops, or if you are not able to drink enough fluids, you are at risk of becoming dehydrated. Follow  recommendations for eating and drinking and take over-the-counter and prescription medicines only as told by your health care provider. Contact a health care provider right away if your symptoms worsen or you have new symptoms. Keep all follow-up visits. This is important. This information is not intended to replace advice given to you by your health care provider. Make  sure you discuss any questions you have with your health care provider. Document Revised: 06/13/2021 Document Reviewed: 06/13/2021 Elsevier Patient Education  [place].

## 2021-11-19 NOTE — Progress Notes (Signed)
Acute Office Visit  Subjective:    Patient ID: Connie Rivera, female    DOB: 01/03/2001, 20 y.o.   MRN: [id number]  Chief Complaint  Patient presents with   Nausea    HPI Patient is in today for nausea and chills x 2 days. She denies diarrhea, constipation, vomiting, cough, congestion, fever, or abdominal pain. She has been exposed to the flu and would like to be tested. She has been taking zofran 4 mg. She helps for a short period but wears off way before she can take the next dose. She is staying well hydrated.     Past Medical History:  Diagnosis Date   Amenorrhea 07/10/2015   Asthma    Depression    Irregular periods 07/10/2015   Obesity    Scoliosis    Sleep apnea     Past Surgical History:  Procedure Laterality Date   TONSILLECTOMY     TONSILLECTOMY AND ADENOIDECTOMY     TONSILLECTOMY AND ADENOIDECTOMY     TYMPANOSTOMY TUBE PLACEMENT      Family History  Problem Relation Age of Onset   Hyperlipidemia Father    Hypertension Father    Migraines Mother    Febrile seizures Sister        Resolved   ADD / ADHD Sister    Anxiety disorder Sister    Bipolar disorder Sister    Migraines Maternal Grandmother    Diabetes Maternal Grandfather    Kidney disease Maternal Grandfather    Hypertension Maternal Grandfather    Hyperlipidemia Maternal Grandfather    Heart murmur Maternal Grandfather    Rheumatic fever Maternal Grandfather    Cancer Paternal Grandmother        breast   COPD Paternal Grandmother    Emphysema Paternal Grandmother    Congestive Heart Failure Paternal Grandmother    Cancer Paternal Grandfather    Heart attack Neg Hx    Sudden death Neg Hx     Social History   Socioeconomic History   Marital status: Single    Spouse name: Not on file   Number of children: Not on file   Years of education: Not on file   Highest education level: Not on file  Occupational History   Not on file  Tobacco Use   Smoking status: Light Smoker    Types:  Cigarettes   Smokeless tobacco: Never   Tobacco comments:    Patient is vaping off and on.  Not daily  Vaping Use   Vaping Use: Never used  Substance and Sexual Activity   Alcohol use: No   Drug use: No   Sexual activity: Yes    Birth control/protection: Condom, Implant  Other Topics Concern   Not on file  Social History Narrative   Winston is a 10th grade student.   She attends [employer].   She lives with both parents and her cousin.   She enjoys basketball, hanging with friends, and reading.   Social Determinants of Health   Financial Resource Strain: Not on file  Food Insecurity: Not on file  Transportation Needs: Not on file  Physical Activity: Not on file  Stress: Not on file  Social Connections: Not on file  Intimate Partner Violence: Not on file    Outpatient Medications Prior to Visit  Medication Sig Dispense Refill   metFORMIN (GLUCOPHAGE) 500 MG tablet Take 500 mg by mouth 3 (three) times daily.     PROAIR HFA 108 (90 Base) MCG/ACT inhaler  Inhale into the lungs. (Patient not taking: Reported on 02/06/2021)     Nutritional Supplements (KETO PO) Take by mouth. (Patient not taking: Reported on 02/06/2021)     Orlistat (ALLI PO) Take by mouth. (Patient not taking: Reported on 02/06/2021)     No facility-administered medications prior to visit.    Allergies  Allergen Reactions   Hydrocodone Palpitations   Pineapple Anaphylaxis and Itching   Wound Dressing Adhesive Itching and Rash   Adhesive [Tape] Rash   Latex Rash    Review of Systems As per HPI.    Objective:    Physical Exam Vitals and nursing note reviewed.  Constitutional:      General: She is not in acute distress.    Appearance: She is not ill-appearing, toxic-appearing or diaphoretic.  Cardiovascular:     Rate and Rhythm: Normal rate and regular rhythm.     Pulses: Normal pulses.     Heart sounds: Normal heart sounds.  Pulmonary:     Effort: No respiratory distress.     Breath  sounds: Normal breath sounds.  Abdominal:     General: Bowel sounds are normal. There is no distension.     Palpations: Abdomen is soft.     Tenderness: There is no abdominal tenderness. There is no guarding or rebound.  Musculoskeletal:     Right lower leg: No edema.     Left lower leg: No edema.  Skin:    General: Skin is warm and dry.  Neurological:     Mental Status: She is alert.  Psychiatric:        Mood and Affect: Mood normal.        Behavior: Behavior normal.    BP 121/73   Pulse 70   Temp (!) 97.1 F (36.2 C) (Temporal)   Ht 5' 11.45" (1.815 m)   Wt (!) 350 lb 8 oz (159 kg)   BMI 48.27 kg/m  Wt Readings from Last 3 Encounters:  11/19/21 (!) 350 lb 8 oz (159 kg)  02/06/21 (!) 380 lb 2 oz (172.4 kg)  10/07/20 (!) 362 lb 6.4 oz (164.4 kg) (>99 %, Z= 3.14)*   * Growth percentiles are based on CDC (Girls, 2-20 Years) data.    Health Maintenance Due  Topic Date Due   Pneumococcal Vaccine 68-63 Years old (1 - PPSV23 if available, else PCV20) 04/27/2002   INFLUENZA VACCINE  07/21/2021   CHLAMYDIA SCREENING  07/25/2021   PAP-Cervical Cytology Screening  10/21/2021   PAP SMEAR-Modifier  10/21/2021    There are no preventive care reminders to display for this patient.   Lab Results  Component Value Date   TSH 0.898 08/12/2015   Lab Results  Component Value Date   WBC 12.5 (H) 12/06/2020   HGB 16.5 (H) 12/06/2020   HCT 48.2 (H) 12/06/2020   MCV 88 12/06/2020   PLT 270 12/06/2020   Lab Results  Component Value Date   NA 146 (H) 12/06/2020   K 4.1 12/06/2020   CO2 26 12/06/2020   GLUCOSE 79 12/06/2020   BUN 15 12/06/2020   CREATININE 0.89 12/06/2020   BILITOT 0.3 12/06/2020   ALKPHOS 76 12/06/2020   AST 19 12/06/2020   ALT 26 12/06/2020   PROT 6.1 12/06/2020   ALBUMIN 4.1 12/06/2020   CALCIUM 9.5 12/06/2020   ANIONGAP 6 08/01/2015   Lab Results  Component Value Date   CHOL 109 04/21/2017   Lab Results  Component Value Date   HDL 29 (L)  04/21/2017   Lab Results  Component Value Date   LDLCALC 65 04/21/2017   Lab Results  Component Value Date   TRIG 76 04/21/2017   Lab Results  Component Value Date   CHOLHDL 3.8 04/21/2017   Lab Results  Component Value Date   HGBA1C 5.1 04/21/2017       Assessment & Plan:   Connie Rivera was seen today for nausea.  Diagnoses and all orders for this visit:  Exposure to the flu Chills Nausea Negative flu today in the office. Benign exam today. Discussed symptomatic care and hydration. Will try zofran 8 mg since 4 mg has been insuffient.  -     Veritor Flu A/B Waived -     ondansetron (ZOFRAN) 8 MG tablet; Take 1 tablet (8 mg total) by mouth every 8 (eight) hours as needed for nausea or vomiting.  Return to office for new or worsening symptoms, or if symptoms persist.   The patient indicates understanding of these issues and agrees with the plan.  Gwenlyn Perking, FNP

## 2021-11-21 DIAGNOSIS — L7633 Postprocedural seroma of skin and subcutaneous tissue following a dermatologic procedure: Secondary | ICD-10-CM | POA: Diagnosis not present

## 2021-12-10 ENCOUNTER — Encounter: Payer: Self-pay | Admitting: Family Medicine

## 2021-12-10 ENCOUNTER — Ambulatory Visit: Payer: Medicaid Other | Admitting: Family Medicine

## 2021-12-10 VITALS — BP 109/70 | Temp 98.4°F | Ht 71.4 in | Wt 346.2 lb

## 2021-12-10 DIAGNOSIS — F411 Generalized anxiety disorder: Secondary | ICD-10-CM | POA: Diagnosis not present

## 2021-12-10 DIAGNOSIS — M5442 Lumbago with sciatica, left side: Secondary | ICD-10-CM

## 2021-12-10 DIAGNOSIS — R109 Unspecified abdominal pain: Secondary | ICD-10-CM | POA: Diagnosis not present

## 2021-12-10 DIAGNOSIS — F3341 Major depressive disorder, recurrent, in partial remission: Secondary | ICD-10-CM

## 2021-12-10 LAB — URINALYSIS, ROUTINE W REFLEX MICROSCOPIC
Bilirubin, UA: NEGATIVE
Glucose, UA: NEGATIVE
Ketones, UA: NEGATIVE
Leukocytes,UA: NEGATIVE
Nitrite, UA: NEGATIVE
Protein,UA: NEGATIVE
RBC, UA: NEGATIVE
Specific Gravity, UA: 1.025 (ref 1.005–1.030)
Urobilinogen, Ur: 0.2 mg/dL (ref 0.2–1.0)
pH, UA: 6 (ref 5.0–7.5)

## 2021-12-10 MED ORDER — BUSPIRONE HCL 10 MG PO TABS: 10.0000 mg | ORAL_TABLET | Freq: Two times a day (BID) | ORAL | 1 refills | Status: DC

## 2021-12-10 MED ORDER — TIZANIDINE HCL 4 MG PO TABS: 4.0000 mg | ORAL_TABLET | Freq: Four times a day (QID) | ORAL | 0 refills | Status: DC | PRN

## 2021-12-10 NOTE — Patient Instructions (Signed)
Sciatica Sciatica is pain, numbness, weakness, or tingling along the path of the sciatic nerve. The sciatic nerve starts in the lower back and runs down the back of each leg. The nerve controls the muscles in the lower leg and in the back of the knee. It also provides feeling (sensation) to the back of the thigh, the lower leg, and the sole of the foot. Sciatica is a symptom of another medical condition that pinches or puts pressure on the sciatic nerve. Sciatica most often only affects one side of the body. Sciatica usually goes away on its own or with treatment. In some cases, sciatica may come back (recur). What are the causes? This condition is caused by pressure on the sciatic nerve or pinching of the nerve. This may be the result of: A disk in between the bones of the spine bulging out too far (herniated disk). Age-related changes in the spinal disks. A pain disorder that affects a muscle in the buttock. Extra bone growth near the sciatic nerve. A break (fracture) of the pelvis. Pregnancy. Tumor. This is rare. What increases the risk? The following factors may make you more likely to develop this condition: Playing sports that place pressure or stress on the spine. Having poor strength and flexibility. A history of back injury or surgery. Sitting for long periods of time. Doing activities that involve repetitive bending or lifting. Obesity. What are the signs or symptoms? Symptoms can vary from mild to very severe, and they may include: Any of these problems in the lower back, leg, hip, or buttock: Mild tingling, numbness, or dull aches. Burning sensations. Sharp pains. Numbness in the back of the calf or the sole of the foot. Leg weakness. Severe back pain that makes movement difficult. Symptoms may get worse when you cough, sneeze, or laugh, or when you sit or stand for long periods of time. How is this diagnosed? This condition may be diagnosed based on: Your symptoms and  medical history. A physical exam. Blood tests. Imaging tests, such as: X-rays. MRI. CT scan. How is this treated? In many cases, this condition improves on its own without treatment. However, treatment may include: Reducing or modifying physical activity. Exercising and stretching. Icing and applying heat to the affected area. Medicines that help to: Relieve pain and swelling. Relax your muscles. Injections of medicines that help to relieve pain, irritation, and inflammation around the sciatic nerve (steroids). Surgery. Follow these instructions at home: Medicines Take over-the-counter and prescription medicines only as told by your health care provider. Ask your health care provider if the medicine prescribed to you: Requires you to avoid driving or using heavy machinery. Can cause constipation. You may need to take these actions to prevent or treat constipation: Drink enough fluid to keep your urine pale yellow. Take over-the-counter or prescription medicines. Eat foods that are high in fiber, such as beans, whole grains, and fresh fruits and vegetables. Limit foods that are high in fat and processed sugars, such as fried or sweet foods. Managing pain   If directed, put ice on the affected area. Put ice in a plastic bag. Place a towel between your skin and the bag. Leave the ice on for 20 minutes, 2-3 times a day. If directed, apply heat to the affected area. Use the heat source that your health care provider recommends, such as a moist heat pack or a heating pad. Place a towel between your skin and the heat source. Leave the heat on for 20-30 minutes. Remove   the heat if your skin turns bright red. This is especially important if you are unable to feel pain, heat, or cold. You may have a greater risk of getting burned. Activity  Return to your normal activities as told by your health care provider. Ask your health care provider what activities are safe for you. Avoid  activities that make your symptoms worse. Take brief periods of rest throughout the day. When you rest for longer periods, mix in some mild activity or stretching between periods of rest. This will help to prevent stiffness and pain. Avoid sitting for long periods of time without moving. Get up and move around at least one time each hour. Exercise and stretch regularly, as told by your health care provider. Do not lift anything that is heavier than 10 lb (4.5 kg) while you have symptoms of sciatica. When you do not have symptoms, you should still avoid heavy lifting, especially repetitive heavy lifting. When you lift objects, always use proper lifting technique, which includes: Bending your knees. Keeping the load close to your body. Avoiding twisting. General instructions Maintain a healthy weight. Excess weight puts extra stress on your back. Wear supportive, comfortable shoes. Avoid wearing high heels. Avoid sleeping on a mattress that is too soft or too hard. A mattress that is firm enough to support your back when you sleep may help to reduce your pain. Keep all follow-up visits as told by your health care provider. This is important. Contact a health care provider if: You have pain that: Wakes you up when you are sleeping. Gets worse when you lie down. Is worse than you have experienced in the past. Lasts longer than 4 weeks. You have an unexplained weight loss. Get help right away if: You are not able to control when you urinate or have bowel movements (incontinence). You have: Weakness in your lower back, pelvis, buttocks, or legs that gets worse. Redness or swelling of your back. A burning sensation when you urinate. Summary Sciatica is pain, numbness, weakness, or tingling along the path of the sciatic nerve. This condition is caused by pressure on the sciatic nerve or pinching of the nerve. Sciatica can cause pain, numbness, or tingling in the lower back, legs, hips, and  buttocks. Treatment often includes rest, exercise, medicines, and applying ice or heat. This information is not intended to replace advice given to you by your health care provider. Make sure you discuss any questions you have with your health care provider. Document Revised: 12/26/2018 Document Reviewed: 12/26/2018 Elsevier Patient Education  2022 [employer]  

## 2021-12-10 NOTE — Progress Notes (Addendum)
Acute Office Visit  Subjective:    Patient ID: DAILY DOE, female    DOB: February 10, 2001, 20 y.o.   MRN: [id number]  Chief Complaint  Patient presents with   Flank Pain    HPI Patient is in today for lower left sided back pain x 2 days. The pain is a constant ache. She is worried about a possible UTI. She denies urinary symptoms. Denies fever or chills. She does have a history of sciatica that flares up randomly.   She also reports that her anxiety has been worse lately. She doesn't feel like she has many depression symptoms but she would like to try medication to help control her anxiety.    Depression screen [facility] 2/9 12/10/2021 11/19/2021 02/06/2021  Decreased Interest 0 0 0  Down, Depressed, Hopeless 2 0 0  PHQ - 2 Score 2 0 0  Altered sleeping 2 0 1  Tired, decreased energy 2 0 1  Change in appetite 1 0 1  Feeling bad or failure about yourself  3 0 0  Trouble concentrating 0 0 0  Moving slowly or fidgety/restless 0 0 0  Suicidal thoughts 0 0 -  PHQ-9 Score 10 0 3  Difficult doing work/chores Somewhat difficult Not difficult at all -  Some recent data might be hidden   GAD 7 : Generalized Anxiety Score 12/10/2021 11/19/2021 07/25/2020 03/18/2020  Nervous, Anxious, on Edge 3 2 2 1   Control/stop worrying 3 2 3 1   Worry too much - different things 3 2 3 1   Trouble relaxing 3 2 3 1   Restless 2 1 3 1   Easily annoyed or irritable 2 1 3 1   Afraid - awful might happen 3 2 3 1   Total GAD 7 Score 19 12 20 7   Anxiety Difficulty Very difficult Somewhat difficult Somewhat difficult -      Past Medical History:  Diagnosis Date   Amenorrhea 07/10/2015   Asthma    Depression    Irregular periods 07/10/2015   Obesity    Scoliosis    Sleep apnea     Past Surgical History:  Procedure Laterality Date   TONSILLECTOMY     TONSILLECTOMY AND ADENOIDECTOMY     TONSILLECTOMY AND ADENOIDECTOMY     TYMPANOSTOMY TUBE PLACEMENT      Family History  Problem Relation Age of Onset    Hyperlipidemia Father    Hypertension Father    Migraines Mother    Febrile seizures Sister        Resolved   ADD / ADHD Sister    Anxiety disorder Sister    Bipolar disorder Sister    Migraines Maternal Grandmother    Diabetes Maternal Grandfather    Kidney disease Maternal Grandfather    Hypertension Maternal Grandfather    Hyperlipidemia Maternal Grandfather    Heart murmur Maternal Grandfather    Rheumatic fever Maternal Grandfather    Cancer Paternal Grandmother        breast   COPD Paternal Grandmother    Emphysema Paternal Grandmother    Congestive Heart Failure Paternal Grandmother    Cancer Paternal Grandfather    Heart attack Neg Hx    Sudden death Neg Hx     Social History   Socioeconomic History   Marital status: Single    Spouse name: Not on file   Number of children: Not on file   Years of education: Not on file   Highest education level: Not on file  Occupational History  Not on file  Tobacco Use   Smoking status: Light Smoker    Types: Cigarettes   Smokeless tobacco: Never   Tobacco comments:    Patient is vaping off and on.  Not daily  Vaping Use   Vaping Use: Never used  Substance and Sexual Activity   Alcohol use: No   Drug use: No   Sexual activity: Yes    Birth control/protection: Condom, Implant  Other Topics Concern   Not on file  Social History Narrative   Nickcole is a 10th grade student.   She attends [employer].   She lives with both parents and her cousin.   She enjoys basketball, hanging with friends, and reading.   Social Determinants of Health   Financial Resource Strain: Not on file  Food Insecurity: Not on file  Transportation Needs: Not on file  Physical Activity: Not on file  Stress: Not on file  Social Connections: Not on file  Intimate Partner Violence: Not on file    Outpatient Medications Prior to Visit  Medication Sig Dispense Refill   metFORMIN (GLUCOPHAGE) 500 MG tablet Take 500 mg by mouth 3  (three) times daily.     PROAIR HFA 108 (90 Base) MCG/ACT inhaler Inhale into the lungs. (Patient not taking: Reported on 02/06/2021)     ondansetron (ZOFRAN) 8 MG tablet Take 1 tablet (8 mg total) by mouth every 8 (eight) hours as needed for nausea or vomiting. 20 tablet 0   No facility-administered medications prior to visit.    Allergies  Allergen Reactions   Hydrocodone Palpitations   Pineapple Anaphylaxis and Itching   Wound Dressing Adhesive Itching and Rash   Adhesive [Tape] Rash   Latex Rash    Review of Systems As per HPI.    Objective:    Physical Exam Vitals and nursing note reviewed.  Constitutional:      General: She is not in acute distress.    Appearance: She is not ill-appearing, toxic-appearing or diaphoretic.  Pulmonary:     Effort: Pulmonary effort is normal. No respiratory distress.  Abdominal:     Palpations: Abdomen is soft.     Tenderness: There is no abdominal tenderness. There is no right CVA tenderness, left CVA tenderness, guarding or rebound.  Musculoskeletal:     Lumbar back: Tenderness (left paraspinal) present. No swelling, edema, deformity, signs of trauma or bony tenderness.  Skin:    General: Skin is warm and dry.  Neurological:     General: No focal deficit present.     Mental Status: She is alert and oriented to person, place, and time.  Psychiatric:        Mood and Affect: Mood normal.        Behavior: Behavior normal.   Urine dipstick shows negative for all components.     BP 109/70    Temp 98.4 F (36.9 C) (Temporal)    Ht 5' 11.4" (1.814 m)    Wt (!) 346 lb 4 oz (157.1 kg)    BMI 47.75 kg/m  Wt Readings from Last 3 Encounters:  12/10/21 (!) 346 lb 4 oz (157.1 kg)  11/19/21 (!) 350 lb 8 oz (159 kg)  02/06/21 (!) 380 lb 2 oz (172.4 kg)    Health Maintenance Due  Topic Date Due   Pneumococcal Vaccine 62-58 Years old (1 - PPSV23 if available, else PCV20) 04/27/2002   INFLUENZA VACCINE  07/21/2021   CHLAMYDIA SCREENING   07/25/2021   PAP-Cervical Cytology Screening  10/21/2021   PAP SMEAR-Modifier  10/21/2021    There are no preventive care reminders to display for this patient.   Lab Results  Component Value Date   TSH 0.898 08/12/2015   Lab Results  Component Value Date   WBC 12.5 (H) 12/06/2020   HGB 16.5 (H) 12/06/2020   HCT 48.2 (H) 12/06/2020   MCV 88 12/06/2020   PLT 270 12/06/2020   Lab Results  Component Value Date   NA 146 (H) 12/06/2020   K 4.1 12/06/2020   CO2 26 12/06/2020   GLUCOSE 79 12/06/2020   BUN 15 12/06/2020   CREATININE 0.89 12/06/2020   BILITOT 0.3 12/06/2020   ALKPHOS 76 12/06/2020   AST 19 12/06/2020   ALT 26 12/06/2020   PROT 6.1 12/06/2020   ALBUMIN 4.1 12/06/2020   CALCIUM 9.5 12/06/2020   ANIONGAP 6 08/01/2015   Lab Results  Component Value Date   CHOL 109 04/21/2017   Lab Results  Component Value Date   HDL 29 (L) 04/21/2017   Lab Results  Component Value Date   LDLCALC 65 04/21/2017   Lab Results  Component Value Date   TRIG 76 04/21/2017   Lab Results  Component Value Date   CHOLHDL 3.8 04/21/2017   Lab Results  Component Value Date   HGBA1C 5.1 04/21/2017       Assessment & Plan:   Collier was seen today for flank pain.  Diagnoses and all orders for this visit:  Acute left-sided low back pain with left-sided sciatica Negative UA. Reassured patient. Zanaflex as below. Rest, heat, ice, NSAIDs, tylenol.  -     Urinalysis, Routine w reflex microscopic -     tiZANidine (ZANAFLEX) 4 MG tablet; Take 1 tablet (4 mg total) by mouth every 6 (six) hours as needed for muscle spasms.  Recurrent major depressive disorder, in partial remission (HCC) GAD (generalized anxiety disorder) Uncontrolled. Denies SI. Reports that depression symptoms are not bothersome but anxiety symptoms are. Will start buspar as below.  -     busPIRone (BUSPAR) 10 MG tablet; Take 1 tablet (10 mg total) by mouth 2 (two) times daily.  Keep scheduled follow up  appointment with CPE.  The patient indicates understanding of these issues and agrees with the plan.  Gwenlyn Perking, FNP

## 2021-12-11 ENCOUNTER — Telehealth: Payer: Self-pay

## 2021-12-11 NOTE — Telephone Encounter (Signed)
Transition Care Management Follow-up Telephone Call Date of discharge and from where: 12/10/2021-[facility] How have you been since you were released from the hospital? Patient stated she is doing ok but is a little sore. She will pick up her medication today.  Any questions or concerns? No  Items Reviewed: Did the pt receive and understand the discharge instructions provided? Yes  Medications obtained and verified? Yes  Other? No  Any new allergies since your discharge? No  Dietary orders reviewed? No Do you have support at home? Yes   Home Care and Equipment/Supplies: Were home health services ordered? not applicable If so, what is the name of the agency? N/A  Has the agency set up a time to come to the patient's home? not applicable Were any new equipment or medical supplies ordered?  No What is the name of the medical supply agency? N/A Were you able to get the supplies/equipment? not applicable Do you have any questions related to the use of the equipment or supplies? No  Functional Questionnaire: (I = Independent and D = Dependent) ADLs: I  Bathing/Dressing- I  Meal Prep- I  Eating- I  Maintaining continence- I  Transferring/Ambulation- I  Managing Meds- I  Follow up appointments reviewed:  PCP Hospital f/u appt confirmed? No   Specialist Hospital f/u appt confirmed? No   Are transportation arrangements needed? No  If their condition worsens, is the pt aware to call PCP or go to the Emergency Dept.? Yes Was the patient provided with contact information for the PCP's office or ED? Yes Was to pt encouraged to call back with questions or concerns? Yes

## 2021-12-17 ENCOUNTER — Other Ambulatory Visit: Payer: Self-pay

## 2021-12-17 ENCOUNTER — Ambulatory Visit (INDEPENDENT_AMBULATORY_CARE_PROVIDER_SITE_OTHER): Payer: Medicaid Other | Admitting: Family Medicine

## 2021-12-17 ENCOUNTER — Other Ambulatory Visit (HOSPITAL_COMMUNITY)
Admission: RE | Admit: 2021-12-17 | Discharge: 2021-12-17 | Disposition: A | Discharge status: Home / Self Care | Payer: Medicaid Other | Source: Ambulatory Visit | Attending: Family Medicine | Admitting: Family Medicine

## 2021-12-17 ENCOUNTER — Encounter: Payer: Self-pay | Admitting: Family Medicine

## 2021-12-17 VITALS — BP 110/63 | HR 80 | Temp 98.0°F | Ht 71.4 in | Wt 350.5 lb

## 2021-12-17 DIAGNOSIS — Z113 Encounter for screening for infections with a predominantly sexual mode of transmission: Secondary | ICD-10-CM

## 2021-12-17 DIAGNOSIS — Z23 Encounter for immunization: Secondary | ICD-10-CM | POA: Diagnosis not present

## 2021-12-17 DIAGNOSIS — Z1329 Encounter for screening for other suspected endocrine disorder: Secondary | ICD-10-CM

## 2021-12-17 DIAGNOSIS — Z13220 Encounter for screening for lipoid disorders: Secondary | ICD-10-CM

## 2021-12-17 DIAGNOSIS — Z124 Encounter for screening for malignant neoplasm of cervix: Secondary | ICD-10-CM | POA: Diagnosis not present

## 2021-12-17 DIAGNOSIS — Z0000 Encounter for general adult medical examination without abnormal findings: Secondary | ICD-10-CM | POA: Insufficient documentation

## 2021-12-17 DIAGNOSIS — Z13228 Encounter for screening for other metabolic disorders: Secondary | ICD-10-CM

## 2021-12-17 DIAGNOSIS — Z0001 Encounter for general adult medical examination with abnormal findings: Secondary | ICD-10-CM

## 2021-12-17 LAB — PREGNANCY, URINE: Preg Test, Ur: NEGATIVE

## 2021-12-17 NOTE — Progress Notes (Signed)
Subjective:  Patient ID: Connie Rivera, female    DOB: 10-Sep-2001, 20 y.o.   MRN: [id number]  Patient Care Team: Gwenlyn Perking, FNP as PCP - General (Family Medicine)   Chief Complaint:  Annual Exam   HPI: Connie Rivera is a 20 y.o. female presenting on 12/17/2021 for Annual Exam   Patient presents today for annual physical exam with PAP.  She has a history of PCOS and is currently not on any contraceptive.  She is sexually active, same partner for the last 6 years, denies any indications of STIs.  Her cycles are very irregular due to PCOS but denies possibility of pregnancy.  States she has not had a cycle in over 5 months.  She does not follow a strict diet or exercise routine.  She denies any specific complaints or concerns today.     Relevant past medical, surgical, family, and social history reviewed and updated as indicated.  Allergies and medications reviewed and updated. Data reviewed: Chart in Epic.   Past Medical History:  Diagnosis Date   Amenorrhea 07/10/2015   Asthma    Depression    Irregular periods 07/10/2015   Obesity    Scoliosis    Sleep apnea     Past Surgical History:  Procedure Laterality Date   TONSILLECTOMY     TONSILLECTOMY AND ADENOIDECTOMY     TONSILLECTOMY AND ADENOIDECTOMY     TYMPANOSTOMY TUBE PLACEMENT      Social History   Socioeconomic History   Marital status: Married    Spouse name: Not on file   Number of children: Not on file   Years of education: Not on file   Highest education level: Not on file  Occupational History   Not on file  Tobacco Use   Smoking status: Light Smoker    Types: Cigarettes   Smokeless tobacco: Never   Tobacco comments:    Patient is vaping off and on.  Not daily  Vaping Use   Vaping Use: Never used  Substance and Sexual Activity   Alcohol use: No   Drug use: No   Sexual activity: Yes    Birth control/protection: Condom, Implant  Other Topics Concern   Not on file  Social History  Narrative   Connie Rivera is a 10th grade student.   She attends [employer].   She lives with both parents and her cousin.   She enjoys basketball, hanging with friends, and reading.   Social Determinants of Health   Financial Resource Strain: Not on file  Food Insecurity: Not on file  Transportation Needs: Not on file  Physical Activity: Not on file  Stress: Not on file  Social Connections: Not on file  Intimate Partner Violence: Not on file    Outpatient Encounter Medications as of 12/17/2021  Medication Sig   busPIRone (BUSPAR) 10 MG tablet Take 1 tablet (10 mg total) by mouth 2 (two) times daily.   metFORMIN (GLUCOPHAGE) 500 MG tablet Take 500 mg by mouth 3 (three) times daily.   PROAIR HFA 108 (90 Base) MCG/ACT inhaler Inhale into the lungs.   tiZANidine (ZANAFLEX) 4 MG tablet Take 1 tablet (4 mg total) by mouth every 6 (six) hours as needed for muscle spasms.   No facility-administered encounter medications on file as of 12/17/2021.    Allergies  Allergen Reactions   Hydrocodone Palpitations   Pineapple Anaphylaxis and Itching   Wound Dressing Adhesive Itching and Rash   Adhesive [Tape] Rash  Latex Rash    Review of Systems  Constitutional:  Negative for activity change, appetite change, chills, diaphoresis, fatigue, fever and unexpected weight change.  HENT: Negative.    Eyes: Negative.   Respiratory:  Negative for cough, chest tightness and shortness of breath.   Cardiovascular:  Negative for chest pain, palpitations and leg swelling.  Gastrointestinal:  Negative for blood in stool, constipation, diarrhea, nausea and vomiting.  Endocrine: Negative.   Genitourinary:  Positive for menstrual problem. Negative for decreased urine volume, difficulty urinating, dyspareunia, dysuria, enuresis, flank pain, frequency, genital sores, hematuria, pelvic pain, urgency, vaginal bleeding, vaginal discharge and vaginal pain.  Musculoskeletal:  Negative for arthralgias and  myalgias.  Skin: Negative.   Allergic/Immunologic: Negative.   Neurological:  Negative for dizziness and headaches.  Hematological: Negative.   Psychiatric/Behavioral:  Negative for confusion, hallucinations, sleep disturbance and suicidal ideas.   All other systems reviewed and are negative.      Objective:  BP 110/63    Pulse 80    Temp 98 F (36.7 C) (Temporal)    Ht 5' 11.4" (1.814 m)    Wt (!) 350 lb 8 oz (159 kg)    BMI 48.34 kg/m    Wt Readings from Last 3 Encounters:  12/17/21 (!) 350 lb 8 oz (159 kg)  12/10/21 (!) 346 lb 4 oz (157.1 kg)  11/19/21 (!) 350 lb 8 oz (159 kg)    Physical Exam Vitals and nursing note reviewed. Exam conducted with a chaperone present.  Constitutional:      General: She is not in acute distress.    Appearance: Normal appearance. She is well-developed and well-groomed. She is morbidly obese. She is not ill-appearing, toxic-appearing or diaphoretic.  HENT:     Head: Normocephalic and atraumatic.     Jaw: There is normal jaw occlusion.     Right Ear: Hearing, tympanic membrane, ear canal and external ear normal.     Left Ear: Hearing, tympanic membrane, ear canal and external ear normal.     Nose: Nose normal.     Mouth/Throat:     Lips: Pink.     Mouth: Mucous membranes are moist.     Pharynx: Oropharynx is clear. Uvula midline.  Eyes:     General: Lids are normal.     Extraocular Movements: Extraocular movements intact.     Conjunctiva/sclera: Conjunctivae normal.     Pupils: Pupils are equal, round, and reactive to light.  Neck:     Thyroid: No thyroid mass, thyromegaly or thyroid tenderness.     Vascular: No carotid bruit or JVD.     Trachea: Trachea and phonation normal.  Cardiovascular:     Rate and Rhythm: Normal rate and regular rhythm.     Chest Wall: PMI is not displaced.     Pulses: Normal pulses.     Heart sounds: Normal heart sounds. No murmur heard.   No friction rub. No gallop.  Pulmonary:     Effort: Pulmonary effort  is normal. No respiratory distress.     Breath sounds: Normal breath sounds. No wheezing.  Abdominal:     General: Bowel sounds are normal. There is no distension or abdominal bruit.     Palpations: Abdomen is soft. There is no hepatomegaly, splenomegaly or mass.     Tenderness: There is no abdominal tenderness. There is no right CVA tenderness, left CVA tenderness, guarding or rebound.     Hernia: No hernia is present. There is no hernia in the left  inguinal area or right inguinal area.  Genitourinary:    General: Normal vulva.     Exam position: Lithotomy position.     Pubic Area: No rash or pubic lice.      Tanner stage (genital): 5.     Labia:        Right: No rash, tenderness, lesion or injury.        Left: No tenderness, lesion or injury.      Urethra: No prolapse, urethral pain, urethral swelling or urethral lesion.     Vagina: Normal.     Cervix: Normal.     Uterus: Normal.      Adnexa:        Right: Fullness present. No tenderness.         Left: Fullness present. No tenderness.       Rectum: Normal.  Musculoskeletal:        General: Normal range of motion.     Cervical back: Normal range of motion and neck supple. No rigidity or tenderness.     Right lower leg: No edema.     Left lower leg: No edema.  Lymphadenopathy:     Cervical: No cervical adenopathy.     Lower Body: No right inguinal adenopathy. No left inguinal adenopathy.  Skin:    General: Skin is warm and dry.     Capillary Refill: Capillary refill takes less than 2 seconds.     Coloration: Skin is not cyanotic, jaundiced or pale.     Findings: No rash.       Neurological:     General: No focal deficit present.     Mental Status: She is alert and oriented to person, place, and time.     Sensory: Sensation is intact.     Motor: Motor function is intact.     Coordination: Coordination is intact.     Gait: Gait is intact.     Deep Tendon Reflexes: Reflexes are normal and symmetric.  Psychiatric:         Attention and Perception: Attention and perception normal.        Mood and Affect: Mood and affect normal.        Speech: Speech normal.        Behavior: Behavior normal. Behavior is cooperative.        Thought Content: Thought content normal.        Cognition and Memory: Cognition and memory normal.        Judgment: Judgment normal.    Results for orders placed or performed in visit on 12/10/21  Urinalysis, Routine w reflex microscopic  Result Value Ref Range   Specific Gravity, UA 1.025 1.005 - 1.030   pH, UA 6.0 5.0 - 7.5   Color, UA Yellow Yellow   Appearance Ur Clear Clear   Leukocytes,UA Negative Negative   Protein,UA Negative Negative/Trace   Glucose, UA Negative Negative   Ketones, UA Negative Negative   RBC, UA Negative Negative   Bilirubin, UA Negative Negative   Urobilinogen, Ur 0.2 0.2 - 1.0 mg/dL   Nitrite, UA Negative Negative       Pertinent labs & imaging results that were available during my care of the patient were reviewed by me and considered in my medical decision making.  Assessment & Plan:  Connie Rivera was seen today for annual exam.  Diagnoses and all orders for this visit:  Annual physical exam Health maintenance discussed in detail.  Influenza vaccine given today.  Diet and exercise  encouraged.  Labs pending. -     Cytology - PAP([place]) -     CMP14+EGFR -     CBC with Differential/Platelet -     HIV Antibody (routine testing w rflx) -     Hepatitis C antibody -     Thyroid Panel With TSH -     Lipid panel -     Pregnancy, urine  Routine screening for STI (sexually transmitted infection) -     Cytology - PAP([place]) -     HIV Antibody (routine testing w rflx) -     Hepatitis C antibody  Screening for cervical cancer -     Cytology - PAP([place])  Screening for lipid disorders -     Lipid panel  Screening for endocrine disorder -     CMP14+EGFR -     Thyroid Panel With TSH  Screening for metabolic disorder -      FIE33+IRJJ -     Thyroid Panel With TSH  Influenza vaccine given today.  Continue all other maintenance medications.  Follow up plan: Return in about 3 months (around 03/17/2022), or if symptoms worsen or fail to improve, for PCP for anxiety.   Continue healthy lifestyle choices, including diet (rich in fruits, vegetables, and lean proteins, and low in salt and simple carbohydrates) and exercise (at least 30 minutes of moderate physical activity daily).  Educational handout given for health maintenance  The above assessment and management plan was discussed with the patient. The patient verbalized understanding of and has agreed to the management plan. Patient is aware to call the clinic if they develop any new symptoms or if symptoms persist or worsen. Patient is aware when to return to the clinic for a follow-up visit. Patient educated on when it is appropriate to go to the emergency department.   Monia Pouch, FNP-C [facility] [phone]

## 2021-12-18 ENCOUNTER — Other Ambulatory Visit: Payer: Self-pay | Admitting: Family Medicine

## 2021-12-18 ENCOUNTER — Encounter: Payer: Self-pay | Admitting: Family Medicine

## 2021-12-18 LAB — CMP14+EGFR
ALT: 21 IU/L (ref 0–32)
AST: 22 IU/L (ref 0–40)
Albumin/Globulin Ratio: 1.8 (ref 1.2–2.2)
Albumin: 3.9 g/dL (ref 3.9–5.0)
Alkaline Phosphatase: 72 IU/L (ref 44–121)
BUN/Creatinine Ratio: 13 (ref 9–23)
BUN: 11 mg/dL (ref 6–20)
Bilirubin Total: 0.3 mg/dL (ref 0.0–1.2)
CO2: 24 mmol/L (ref 20–29)
Calcium: 9.1 mg/dL (ref 8.7–10.2)
Chloride: 106 mmol/L (ref 96–106)
Creatinine, Ser: 0.82 mg/dL (ref 0.57–1.00)
Globulin, Total: 2.2 g/dL (ref 1.5–4.5)
Glucose: 103 mg/dL — ABNORMAL HIGH (ref 70–99)
Potassium: 3.8 mmol/L (ref 3.5–5.2)
Sodium: 145 mmol/L — ABNORMAL HIGH (ref 134–144)
Total Protein: 6.1 g/dL (ref 6.0–8.5)
eGFR: 104 mL/min/{1.73_m2} (ref >59)

## 2021-12-18 LAB — CBC WITH DIFFERENTIAL/PLATELET
Basophils Absolute: 0.1 10*3/uL (ref 0.0–0.2)
Basos: 1 %
EOS (ABSOLUTE): 0.4 10*3/uL (ref 0.0–0.4)
Eos: 5 %
Hematocrit: 46.4 % (ref 34.0–46.6)
Hemoglobin: 15.2 g/dL (ref 11.1–15.9)
Immature Grans (Abs): 0 10*3/uL (ref 0.0–0.1)
Immature Granulocytes: 0 %
Lymphocytes Absolute: 2.5 10*3/uL (ref 0.7–3.1)
Lymphs: 31 %
MCH: 29.7 pg (ref 26.6–33.0)
MCHC: 32.8 g/dL (ref 31.5–35.7)
MCV: 91 fL (ref 79–97)
Monocytes Absolute: 0.6 10*3/uL (ref 0.1–0.9)
Monocytes: 8 %
Neutrophils Absolute: 4.5 10*3/uL (ref 1.4–7.0)
Neutrophils: 55 %
Platelets: 245 10*3/uL (ref 150–450)
RBC: 5.12 x10E6/uL (ref 3.77–5.28)
RDW: 11.9 % (ref 11.7–15.4)
WBC: 8.1 10*3/uL (ref 3.4–10.8)

## 2021-12-18 LAB — LIPID PANEL
Chol/HDL Ratio: 4.1 ratio (ref 0.0–4.4)
Cholesterol, Total: 143 mg/dL (ref 100–199)
HDL: 35 mg/dL — ABNORMAL LOW (ref >39)
LDL Chol Calc (NIH): 85 mg/dL (ref 0–99)
Triglycerides: 127 mg/dL (ref 0–149)
VLDL Cholesterol Cal: 23 mg/dL (ref 5–40)

## 2021-12-18 LAB — THYROID PANEL WITH TSH
Free Thyroxine Index: 1.7 (ref 1.2–4.9)
T3 Uptake Ratio: 24 % (ref 24–39)
T4, Total: 7.2 ug/dL (ref 4.5–12.0)
TSH: 0.951 u[IU]/mL (ref 0.450–4.500)

## 2021-12-18 LAB — HEPATITIS C ANTIBODY: Hep C Virus Ab: <0.1 s/co ratio (ref 0.0–0.9)

## 2021-12-18 LAB — HIV ANTIBODY (ROUTINE TESTING W REFLEX): HIV Screen 4th Generation wRfx: NONREACTIVE

## 2021-12-18 NOTE — Telephone Encounter (Signed)
Pt says that Dr Kathaleen Grinder is not available till Feb. Pt wants a referral to see a nutritionist in [place] or [place].

## 2021-12-23 ENCOUNTER — Ambulatory Visit: Payer: Medicaid Other | Admitting: Nutrition

## 2021-12-24 LAB — CYTOLOGY - PAP
Chlamydia: NEGATIVE
Comment: NEGATIVE
Comment: NEGATIVE
Comment: NEGATIVE
Comment: NEGATIVE
Comment: NORMAL
Diagnosis: NEGATIVE
HSV1: NEGATIVE
HSV2: NEGATIVE
High risk HPV: NEGATIVE
Neisseria Gonorrhea: NEGATIVE
Trichomonas: NEGATIVE

## 2021-12-25 DIAGNOSIS — E8881 Metabolic syndrome: Secondary | ICD-10-CM | POA: Diagnosis not present

## 2021-12-25 DIAGNOSIS — Z6841 Body Mass Index (BMI) 40.0 and over, adult: Secondary | ICD-10-CM | POA: Diagnosis not present

## 2021-12-25 DIAGNOSIS — E282 Polycystic ovarian syndrome: Secondary | ICD-10-CM | POA: Diagnosis not present

## 2021-12-25 DIAGNOSIS — N926 Irregular menstruation, unspecified: Secondary | ICD-10-CM | POA: Diagnosis not present

## 2021-12-27 DIAGNOSIS — S60012A Contusion of left thumb without damage to nail, initial encounter: Secondary | ICD-10-CM | POA: Diagnosis not present

## 2021-12-27 DIAGNOSIS — M7989 Other specified soft tissue disorders: Secondary | ICD-10-CM | POA: Diagnosis not present

## 2021-12-27 DIAGNOSIS — Z91040 Latex allergy status: Secondary | ICD-10-CM | POA: Diagnosis not present

## 2021-12-27 DIAGNOSIS — F17210 Nicotine dependence, cigarettes, uncomplicated: Secondary | ICD-10-CM | POA: Diagnosis not present

## 2021-12-27 DIAGNOSIS — J45909 Unspecified asthma, uncomplicated: Secondary | ICD-10-CM | POA: Diagnosis not present

## 2021-12-27 DIAGNOSIS — M79645 Pain in left finger(s): Secondary | ICD-10-CM | POA: Diagnosis not present

## 2021-12-29 ENCOUNTER — Telehealth: Payer: Self-pay

## 2021-12-29 NOTE — Telephone Encounter (Signed)
Transition Care Management Follow-up Telephone Call Date of discharge and from where: 12/27/2021 from [facility] How have you been since you were released from the hospital? Pt stated that she is feeling and did not have any questions or concerns at this time.  Any questions or concerns? Yes  Items Reviewed: Did the pt receive and understand the discharge instructions provided? Yes  Medications obtained and verified? Yes  Other? No  Any new allergies since your discharge? No  Dietary orders reviewed? No Do you have support at home? Yes   Functional Questionnaire: (I = Independent and D = Dependent) ADLs: I  Bathing/Dressing- I  Meal Prep- I  Eating- I  Maintaining continence- I  Transferring/Ambulation- I  Managing Meds- I   Follow up appointments reviewed:  PCP Hospital f/u appt confirmed? Yes  Scheduled to see Darla Lesches, FNP on 12/31/2021 @ 11:50am. [facility] f/u appt confirmed? No   Are transportation arrangements needed? No  If their condition worsens, is the pt aware to call PCP or go to the Emergency Dept.? Yes Was the patient provided with contact information for the PCP's office or ED? Yes Was to pt encouraged to call back with questions or concerns? Yes

## 2021-12-31 ENCOUNTER — Ambulatory Visit: Payer: Medicaid Other | Admitting: Family Medicine

## 2021-12-31 ENCOUNTER — Encounter: Payer: Self-pay | Admitting: Family Medicine

## 2022-01-01 ENCOUNTER — Ambulatory Visit (INDEPENDENT_AMBULATORY_CARE_PROVIDER_SITE_OTHER): Payer: Medicaid Other | Admitting: Family Medicine

## 2022-01-01 ENCOUNTER — Ambulatory Visit (INDEPENDENT_AMBULATORY_CARE_PROVIDER_SITE_OTHER): Payer: Medicaid Other

## 2022-01-01 ENCOUNTER — Encounter: Payer: Self-pay | Admitting: Family Medicine

## 2022-01-01 VITALS — BP 139/86 | HR 79 | Temp 98.7°F | Ht 70.0 in | Wt 347.0 lb

## 2022-01-01 DIAGNOSIS — M5412 Radiculopathy, cervical region: Secondary | ICD-10-CM | POA: Diagnosis not present

## 2022-01-01 DIAGNOSIS — M542 Cervicalgia: Secondary | ICD-10-CM

## 2022-01-01 MED ORDER — PREDNISONE 20 MG PO TABS: 40.0000 mg | ORAL_TABLET | Freq: Every day | ORAL | 0 refills | Status: AC
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
## 2022-01-05 DIAGNOSIS — E282 Polycystic ovarian syndrome: Secondary | ICD-10-CM | POA: Diagnosis not present

## 2022-01-05 DIAGNOSIS — Z6841 Body Mass Index (BMI) 40.0 and over, adult: Secondary | ICD-10-CM | POA: Diagnosis not present

## 2022-01-07 ENCOUNTER — Ambulatory Visit: Payer: Medicaid Other | Attending: Family Medicine | Admitting: Physical Therapy

## 2022-01-07 ENCOUNTER — Other Ambulatory Visit: Payer: Self-pay

## 2022-01-07 ENCOUNTER — Encounter: Payer: Self-pay | Admitting: Physical Therapy

## 2022-01-07 DIAGNOSIS — M5412 Radiculopathy, cervical region: Secondary | ICD-10-CM | POA: Insufficient documentation

## 2022-01-07 DIAGNOSIS — R293 Abnormal posture: Secondary | ICD-10-CM | POA: Insufficient documentation

## 2022-01-07 DIAGNOSIS — M542 Cervicalgia: Secondary | ICD-10-CM | POA: Insufficient documentation
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
## 2022-01-23 ENCOUNTER — Other Ambulatory Visit: Payer: Self-pay

## 2022-01-23 ENCOUNTER — Ambulatory Visit: Payer: Medicaid Other | Attending: Family Medicine | Admitting: Physical Therapy

## 2022-01-23 ENCOUNTER — Encounter: Payer: Self-pay | Admitting: Physical Therapy

## 2022-01-23 DIAGNOSIS — M6281 Muscle weakness (generalized): Secondary | ICD-10-CM | POA: Insufficient documentation

## 2022-01-23 DIAGNOSIS — R293 Abnormal posture: Secondary | ICD-10-CM | POA: Diagnosis not present

## 2022-01-23 DIAGNOSIS — M25512 Pain in left shoulder: Secondary | ICD-10-CM | POA: Diagnosis not present

## 2022-01-23 DIAGNOSIS — M542 Cervicalgia: Secondary | ICD-10-CM | POA: Diagnosis not present

## 2022-01-23 DIAGNOSIS — G8929 Other chronic pain: Secondary | ICD-10-CM | POA: Insufficient documentation
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
## 2022-01-28 ENCOUNTER — Ambulatory Visit: Payer: Medicaid Other | Admitting: Physical Therapy

## 2022-01-30 ENCOUNTER — Ambulatory Visit: Payer: Medicaid Other | Admitting: *Deleted

## 2022-01-30 ENCOUNTER — Other Ambulatory Visit: Payer: Self-pay

## 2022-01-30 DIAGNOSIS — M542 Cervicalgia: Secondary | ICD-10-CM

## 2022-01-30 DIAGNOSIS — M25512 Pain in left shoulder: Secondary | ICD-10-CM

## 2022-01-30 DIAGNOSIS — G8929 Other chronic pain: Secondary | ICD-10-CM | POA: Diagnosis not present

## 2022-01-30 DIAGNOSIS — R293 Abnormal posture: Secondary | ICD-10-CM

## 2022-01-30 DIAGNOSIS — M6281 Muscle weakness (generalized): Secondary | ICD-10-CM
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
## 2022-02-03 DIAGNOSIS — Z6841 Body Mass Index (BMI) 40.0 and over, adult: Secondary | ICD-10-CM | POA: Diagnosis not present

## 2022-02-03 DIAGNOSIS — E639 Nutritional deficiency, unspecified: Secondary | ICD-10-CM | POA: Diagnosis not present

## 2022-02-03 DIAGNOSIS — R7309 Other abnormal glucose: Secondary | ICD-10-CM | POA: Diagnosis not present

## 2022-02-04 ENCOUNTER — Ambulatory Visit: Payer: Medicaid Other | Admitting: Nutrition

## 2022-02-04 ENCOUNTER — Encounter: Payer: Self-pay | Admitting: Family Medicine

## 2022-02-04 DIAGNOSIS — R7309 Other abnormal glucose: Secondary | ICD-10-CM | POA: Diagnosis not present

## 2022-02-04 DIAGNOSIS — Z6841 Body Mass Index (BMI) 40.0 and over, adult: Secondary | ICD-10-CM | POA: Diagnosis not present

## 2022-02-04 DIAGNOSIS — E282 Polycystic ovarian syndrome: Secondary | ICD-10-CM | POA: Diagnosis not present

## 2022-02-05 ENCOUNTER — Telehealth: Payer: Self-pay | Admitting: Family Medicine

## 2022-02-05 DIAGNOSIS — R7401 Elevation of levels of liver transaminase levels: Secondary | ICD-10-CM

## 2022-02-05 NOTE — Telephone Encounter (Signed)
Yes

## 2022-02-05 NOTE — Telephone Encounter (Signed)
Would you just like a CMP ordered?

## 2022-02-05 NOTE — Telephone Encounter (Signed)
Future lab placed- left detailed message for patient

## 2022-02-06 ENCOUNTER — Other Ambulatory Visit: Payer: Self-pay

## 2022-02-06 ENCOUNTER — Ambulatory Visit: Payer: Medicaid Other | Admitting: *Deleted

## 2022-02-06 DIAGNOSIS — M6281 Muscle weakness (generalized): Secondary | ICD-10-CM

## 2022-02-06 DIAGNOSIS — G8929 Other chronic pain: Secondary | ICD-10-CM

## 2022-02-06 DIAGNOSIS — M542 Cervicalgia: Secondary | ICD-10-CM

## 2022-02-06 DIAGNOSIS — M25512 Pain in left shoulder: Secondary | ICD-10-CM | POA: Diagnosis not present

## 2022-02-06 DIAGNOSIS — R293 Abnormal posture: Secondary | ICD-10-CM
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
## 2022-02-09 ENCOUNTER — Other Ambulatory Visit: Payer: Medicaid Other

## 2022-02-09 ENCOUNTER — Other Ambulatory Visit: Payer: Self-pay

## 2022-02-09 ENCOUNTER — Ambulatory Visit: Payer: Medicaid Other

## 2022-02-09 DIAGNOSIS — G8929 Other chronic pain: Secondary | ICD-10-CM

## 2022-02-09 DIAGNOSIS — M25512 Pain in left shoulder: Secondary | ICD-10-CM | POA: Diagnosis not present

## 2022-02-09 DIAGNOSIS — R293 Abnormal posture: Secondary | ICD-10-CM

## 2022-02-09 DIAGNOSIS — R7401 Elevation of levels of liver transaminase levels: Secondary | ICD-10-CM

## 2022-02-09 DIAGNOSIS — M542 Cervicalgia: Secondary | ICD-10-CM

## 2022-02-09 DIAGNOSIS — M6281 Muscle weakness (generalized): Secondary | ICD-10-CM
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
## 2022-02-10 ENCOUNTER — Encounter: Payer: Self-pay | Admitting: Family Medicine

## 2022-02-10 DIAGNOSIS — R1011 Right upper quadrant pain: Secondary | ICD-10-CM | POA: Diagnosis not present

## 2022-02-10 DIAGNOSIS — E870 Hyperosmolality and hypernatremia: Secondary | ICD-10-CM | POA: Diagnosis not present

## 2022-02-10 DIAGNOSIS — K769 Liver disease, unspecified: Secondary | ICD-10-CM | POA: Diagnosis not present

## 2022-02-10 DIAGNOSIS — E282 Polycystic ovarian syndrome: Secondary | ICD-10-CM | POA: Diagnosis not present

## 2022-02-10 DIAGNOSIS — Z7984 Long term (current) use of oral hypoglycemic drugs: Secondary | ICD-10-CM | POA: Diagnosis not present

## 2022-02-10 DIAGNOSIS — F17210 Nicotine dependence, cigarettes, uncomplicated: Secondary | ICD-10-CM | POA: Diagnosis not present

## 2022-02-10 DIAGNOSIS — R7989 Other specified abnormal findings of blood chemistry: Secondary | ICD-10-CM | POA: Diagnosis not present

## 2022-02-10 DIAGNOSIS — K760 Fatty (change of) liver, not elsewhere classified: Secondary | ICD-10-CM | POA: Diagnosis not present

## 2022-02-10 DIAGNOSIS — R748 Abnormal levels of other serum enzymes: Secondary | ICD-10-CM | POA: Diagnosis not present

## 2022-02-10 LAB — CMP14+EGFR
ALT: 41 IU/L — ABNORMAL HIGH (ref 0–32)
AST: 42 IU/L — ABNORMAL HIGH (ref 0–40)
Albumin/Globulin Ratio: 1.9 (ref 1.2–2.2)
Albumin: 4.2 g/dL (ref 3.9–5.0)
Alkaline Phosphatase: 64 IU/L (ref 44–121)
BUN/Creatinine Ratio: 14 (ref 9–23)
BUN: 12 mg/dL (ref 6–20)
Bilirubin Total: 0.5 mg/dL (ref 0.0–1.2)
CO2: 25 mmol/L (ref 20–29)
Calcium: 9.2 mg/dL (ref 8.7–10.2)
Chloride: 107 mmol/L — ABNORMAL HIGH (ref 96–106)
Creatinine, Ser: 0.87 mg/dL (ref 0.57–1.00)
Globulin, Total: 2.2 g/dL (ref 1.5–4.5)
Glucose: 92 mg/dL (ref 70–99)
Potassium: 4.1 mmol/L (ref 3.5–5.2)
Sodium: 145 mmol/L — ABNORMAL HIGH (ref 134–144)
Total Protein: 6.4 g/dL (ref 6.0–8.5)
eGFR: 97 mL/min/{1.73_m2} (ref >59)

## 2022-02-11 ENCOUNTER — Telehealth: Payer: Self-pay

## 2022-02-11 NOTE — Telephone Encounter (Signed)
Transition Care Management Follow-up Telephone Call Date of discharge and from where: 02/10/2022-[facility]  How have you been since you were released from the hospital? Pt stated she is doing fine.  Any questions or concerns? No  Items Reviewed: Did the pt receive and understand the discharge instructions provided? Yes  Medications obtained and verified?  No medications given at discharge Other? No  Any new allergies since your discharge? No  Dietary orders reviewed? No Do you have support at home? Yes   Home Care and Equipment/Supplies: Were home health services ordered? not applicable If so, what is the name of the agency? N/A  Has the agency set up a time to come to the patient's home? not applicable Were any new equipment or medical supplies ordered?  No What is the name of the medical supply agency? N/A Were you able to get the supplies/equipment? not applicable Do you have any questions related to the use of the equipment or supplies? No  Functional Questionnaire: (I = Independent and D = Dependent) ADLs: I  Bathing/Dressing- I  Meal Prep- I  Eating- I  Maintaining continence- I  Transferring/Ambulation- I  Managing Meds- I  Follow up appointments reviewed:  PCP Hospital f/u appt confirmed? Yes  Scheduled to see Brooke Pace on 02/12/2022 @ 2:50PM. [facility] f/u appt confirmed? Yes  Scheduled to see Gastro on 02/19/2022 @ 9:45am. Are transportation arrangements needed? No  If their condition worsens, is the pt aware to call PCP or go to the Emergency Dept.? Yes Was the patient provided with contact information for the PCP's office or ED? Yes Was to pt encouraged to call back with questions or concerns? Yes

## 2022-02-12 ENCOUNTER — Encounter: Payer: Self-pay | Admitting: Family Medicine

## 2022-02-12 ENCOUNTER — Ambulatory Visit (INDEPENDENT_AMBULATORY_CARE_PROVIDER_SITE_OTHER): Payer: Medicaid Other | Admitting: Family Medicine

## 2022-02-12 VITALS — BP 123/77 | HR 97 | Temp 98.1°F | Ht 70.0 in | Wt 340.0 lb

## 2022-02-12 DIAGNOSIS — R748 Abnormal levels of other serum enzymes: Secondary | ICD-10-CM | POA: Diagnosis not present

## 2022-02-12 DIAGNOSIS — K760 Fatty (change of) liver, not elsewhere classified: Secondary | ICD-10-CM | POA: Insufficient documentation

## 2022-02-12 DIAGNOSIS — K769 Liver disease, unspecified: Secondary | ICD-10-CM

## 2022-02-12 DIAGNOSIS — H66003 Acute suppurative otitis media without spontaneous rupture of ear drum, bilateral: Secondary | ICD-10-CM | POA: Diagnosis not present

## 2022-02-12 MED ORDER — AMOXICILLIN 875 MG PO TABS: 875.0000 mg | ORAL_TABLET | Freq: Two times a day (BID) | ORAL | 0 refills | Status: DC

## 2022-02-12 NOTE — Progress Notes (Signed)
Subjective:  Patient ID: Connie Rivera, female    DOB: July 07, 2001, 21 y.o.   MRN: [id number]  Patient Care Team: Gwenlyn Perking, FNP as PCP - General (Family Medicine)   Chief Complaint:  Follow-up   HPI: Connie Rivera is a 21 y.o. female presenting on 02/12/2022 for Follow-up   Pt presents today for follow up after recent ED visit for abdominal pain. She was having right sided abdominal pain so she went to the ED to be evaluated. Her lipase and AST were minimally elevated. MRI revealed a fatty liver and a focal lesion which was thought to be from the fatty liver disease. She has an appointment with GI pending but needs a referral. She states she is very concerned about these results. Educated on results and potential causes.  She also complains of bilateral ear pain for the last 2 days. Throbbing, worse on left than right. Has not tired anything for the symptoms. No reported fever.    Relevant past medical, surgical, family, and social history reviewed and updated as indicated.  Allergies and medications reviewed and updated. Data reviewed: Chart in Epic.   Past Medical History:  Diagnosis Date   Amenorrhea 07/10/2015   Asthma    Depression    Irregular periods 07/10/2015   Obesity    Scoliosis    Sleep apnea     Past Surgical History:  Procedure Laterality Date   TONSILLECTOMY     TONSILLECTOMY AND ADENOIDECTOMY     TONSILLECTOMY AND ADENOIDECTOMY     TYMPANOSTOMY TUBE PLACEMENT      Social History   Socioeconomic History   Marital status: Married    Spouse name: Not on file   Number of children: Not on file   Years of education: Not on file   Highest education level: Not on file  Occupational History   Not on file  Tobacco Use   Smoking status: Light Smoker    Types: Cigarettes   Smokeless tobacco: Never   Tobacco comments:    Patient is vaping off and on.  Not daily  Vaping Use   Vaping Use: Never used  Substance and Sexual Activity   Alcohol use:  No   Drug use: No   Sexual activity: Yes    Birth control/protection: Condom, Implant  Other Topics Concern   Not on file  Social History Narrative   Juelle is a 10th grade student.   She attends [employer].   She lives with both parents and her cousin.   She enjoys basketball, hanging with friends, and reading.   Social Determinants of Health   Financial Resource Strain: Not on file  Food Insecurity: Not on file  Transportation Needs: Not on file  Physical Activity: Not on file  Stress: Not on file  Social Connections: Not on file  Intimate Partner Violence: Not on file    Outpatient Encounter Medications as of 02/12/2022  Medication Sig   amoxicillin (AMOXIL) 875 MG tablet Take 1 tablet (875 mg total) by mouth 2 (two) times daily. 1 po BID   medroxyPROGESTERone (PROVERA) 10 MG tablet Take by mouth.   ondansetron (ZOFRAN) 4 MG tablet Take 4 mg by mouth every 8 (eight) hours as needed.   PROAIR HFA 108 (90 Base) MCG/ACT inhaler Inhale into the lungs.   busPIRone (BUSPAR) 10 MG tablet Take 1 tablet (10 mg total) by mouth 2 (two) times daily. (Patient not taking: Reported on 02/12/2022)   metFORMIN (GLUCOPHAGE)  500 MG tablet Take 500 mg by mouth 3 (three) times daily. (Patient not taking: Reported on 02/12/2022)   tiZANidine (ZANAFLEX) 4 MG tablet Take 1 tablet (4 mg total) by mouth every 6 (six) hours as needed for muscle spasms. (Patient not taking: Reported on 01/01/2022)   Vitamin D, Ergocalciferol, (DRISDOL) 1.25 MG (50000 UNIT) CAPS capsule Take 50,000 Units by mouth once a week.   No facility-administered encounter medications on file as of 02/12/2022.    Allergies  Allergen Reactions   Hydrocodone Palpitations   Pineapple Anaphylaxis and Itching   Wound Dressing Adhesive Itching and Rash   Adhesive [Tape] Rash   Latex Rash    Review of Systems  Constitutional:  Negative for activity change, appetite change, chills, fatigue and fever.  HENT:  Positive for ear  pain.   Eyes: Negative.   Respiratory:  Negative for cough, chest tightness and shortness of breath.   Cardiovascular:  Negative for chest pain, palpitations and leg swelling.  Gastrointestinal:  Positive for diarrhea (from metformin). Negative for abdominal pain, blood in stool, constipation, nausea and vomiting.  Endocrine: Negative.   Genitourinary:  Negative for decreased urine volume, difficulty urinating, dysuria, frequency and urgency.  Musculoskeletal:  Negative for arthralgias and myalgias.  Skin: Negative.   Allergic/Immunologic: Negative.   Neurological:  Negative for dizziness, tremors, seizures, syncope, facial asymmetry, speech difficulty, weakness, light-headedness, numbness and headaches.  Hematological: Negative.   Psychiatric/Behavioral:  Negative for confusion, hallucinations, sleep disturbance and suicidal ideas.   All other systems reviewed and are negative.      Objective:  BP 123/77    Pulse 97    Temp 98.1 F (36.7 C) (Temporal)    Ht 5' 10"  (1.778 m)    Wt (!) 340 lb (154.2 kg)    BMI 48.78 kg/m    Wt Readings from Last 3 Encounters:  02/12/22 (!) 340 lb (154.2 kg)  01/01/22 (!) 347 lb (157.4 kg)  12/17/21 (!) 350 lb 8 oz (159 kg)    Physical Exam Vitals and nursing note reviewed.  Constitutional:      General: She is not in acute distress.    Appearance: Normal appearance. She is obese. She is not ill-appearing, toxic-appearing or diaphoretic.  HENT:     Head: Normocephalic and atraumatic.     Right Ear: Hearing, ear canal and external ear normal. Tympanic membrane is erythematous and bulging. Tympanic membrane is not perforated.     Left Ear: Hearing, ear canal and external ear normal. Tympanic membrane is erythematous and bulging. Tympanic membrane is not perforated.     Mouth/Throat:     Mouth: Mucous membranes are moist.  Eyes:     Pupils: Pupils are equal, round, and reactive to light.  Cardiovascular:     Rate and Rhythm: Normal rate and  regular rhythm.     Heart sounds: Normal heart sounds.  Pulmonary:     Effort: Pulmonary effort is normal.     Breath sounds: Normal breath sounds.  Abdominal:     General: Abdomen is protuberant. Bowel sounds are normal. There is no distension.     Tenderness: There is no abdominal tenderness.  Skin:    General: Skin is warm and dry.     Capillary Refill: Capillary refill takes less than 2 seconds.  Neurological:     General: No focal deficit present.     Mental Status: She is alert and oriented to person, place, and time.  Psychiatric:  Mood and Affect: Mood normal.        Behavior: Behavior normal.        Thought Content: Thought content normal.        Judgment: Judgment normal.    Results for orders placed or performed in visit on 02/09/22  CMP14+EGFR  Result Value Ref Range   Glucose 92 70 - 99 mg/dL   BUN 12 6 - 20 mg/dL   Creatinine, Ser 0.87 0.57 - 1.00 mg/dL   eGFR 97 >59 mL/min/1.73   BUN/Creatinine Ratio 14 9 - 23   Sodium 145 (H) 134 - 144 mmol/L   Potassium 4.1 3.5 - 5.2 mmol/L   Chloride 107 (H) 96 - 106 mmol/L   CO2 25 20 - 29 mmol/L   Calcium 9.2 8.7 - 10.2 mg/dL   Total Protein 6.4 6.0 - 8.5 g/dL   Albumin 4.2 3.9 - 5.0 g/dL   Globulin, Total 2.2 1.5 - 4.5 g/dL   Albumin/Globulin Ratio 1.9 1.2 - 2.2   Bilirubin Total 0.5 0.0 - 1.2 mg/dL   Alkaline Phosphatase 64 44 - 121 IU/L   AST 42 (H) 0 - 40 IU/L   ALT 41 (H) 0 - 32 IU/L       Pertinent labs & imaging results that were available during my care of the patient were reviewed by me and considered in my medical decision making.  Assessment & Plan:  Connie Rivera was seen today for follow-up.  Diagnoses and all orders for this visit:  Liver lesion Hepatic steatosis Elevated liver enzymes Elevated lipase AST and lipase slightly elevated at recent ED visit. MRI revealed liver lesion which is thought to be due to hepatic steatosis. Referral to GI placed. Pt aware to avoid alcohol, tylenol, and fatty  foods. Will repeat labs today.  -     CMP14+EGFR -     CBC with Differential/Platelet -     Amylase -     Lipase -     Ambulatory referral to Gastroenterology  Non-recurrent acute suppurative otitis media of both ears without spontaneous rupture of tympanic membranes Bilateral AOM. Will treat with amoxil as prescribed. Pt aware to report any new, worsening, or persistent symptoms.  -     amoxicillin (AMOXIL) 875 MG tablet; Take 1 tablet (875 mg total) by mouth 2 (two) times daily. 1 po BID     Continue all other maintenance medications.  Follow up plan: Return if symptoms worsen or fail to improve.   Continue healthy lifestyle choices, including diet (rich in fruits, vegetables, and lean proteins, and low in salt and simple carbohydrates) and exercise (at least 30 minutes of moderate physical activity daily).   The above assessment and management plan was discussed with the patient. The patient verbalized understanding of and has agreed to the management plan. Patient is aware to call the clinic if they develop any new symptoms or if symptoms persist or worsen. Patient is aware when to return to the clinic for a follow-up visit. Patient educated on when it is appropriate to go to the emergency department.   Monia Pouch, FNP-C [facility] [phone]

## 2022-02-13 ENCOUNTER — Other Ambulatory Visit: Payer: Self-pay

## 2022-02-13 ENCOUNTER — Ambulatory Visit: Payer: Medicaid Other

## 2022-02-13 ENCOUNTER — Encounter: Payer: Self-pay | Admitting: Family Medicine

## 2022-02-13 DIAGNOSIS — G8929 Other chronic pain: Secondary | ICD-10-CM

## 2022-02-13 DIAGNOSIS — M6281 Muscle weakness (generalized): Secondary | ICD-10-CM | POA: Diagnosis not present

## 2022-02-13 DIAGNOSIS — R293 Abnormal posture: Secondary | ICD-10-CM | POA: Diagnosis not present

## 2022-02-13 DIAGNOSIS — M25512 Pain in left shoulder: Secondary | ICD-10-CM | POA: Diagnosis not present

## 2022-02-13 DIAGNOSIS — M542 Cervicalgia: Secondary | ICD-10-CM

## 2022-02-13 LAB — CMP14+EGFR
ALT: 56 IU/L — ABNORMAL HIGH (ref 0–32)
AST: 95 IU/L — ABNORMAL HIGH (ref 0–40)
Albumin/Globulin Ratio: 2 (ref 1.2–2.2)
Albumin: 4.5 g/dL (ref 3.9–5.0)
Alkaline Phosphatase: 66 IU/L (ref 44–121)
BUN/Creatinine Ratio: 17 (ref 9–23)
BUN: 13 mg/dL (ref 6–20)
Bilirubin Total: 0.5 mg/dL (ref 0.0–1.2)
CO2: 29 mmol/L (ref 20–29)
Calcium: 9.7 mg/dL (ref 8.7–10.2)
Chloride: 105 mmol/L (ref 96–106)
Creatinine, Ser: 0.78 mg/dL (ref 0.57–1.00)
Globulin, Total: 2.2 g/dL (ref 1.5–4.5)
Glucose: 88 mg/dL (ref 70–99)
Potassium: 3.8 mmol/L (ref 3.5–5.2)
Sodium: 145 mmol/L — ABNORMAL HIGH (ref 134–144)
Total Protein: 6.7 g/dL (ref 6.0–8.5)
eGFR: 111 mL/min/{1.73_m2} (ref >59)

## 2022-02-13 LAB — CBC WITH DIFFERENTIAL/PLATELET
Basophils Absolute: 0.1 10*3/uL (ref 0.0–0.2)
Basos: 2 %
EOS (ABSOLUTE): 0.3 10*3/uL (ref 0.0–0.4)
Eos: 4 %
Hematocrit: 47.3 % — ABNORMAL HIGH (ref 34.0–46.6)
Hemoglobin: 16 g/dL — ABNORMAL HIGH (ref 11.1–15.9)
Immature Grans (Abs): 0 10*3/uL (ref 0.0–0.1)
Immature Granulocytes: 0 %
Lymphocytes Absolute: 2.9 10*3/uL (ref 0.7–3.1)
Lymphs: 36 %
MCH: 29.8 pg (ref 26.6–33.0)
MCHC: 33.8 g/dL (ref 31.5–35.7)
MCV: 88 fL (ref 79–97)
Monocytes Absolute: 0.6 10*3/uL (ref 0.1–0.9)
Monocytes: 7 %
Neutrophils Absolute: 4.2 10*3/uL (ref 1.4–7.0)
Neutrophils: 51 %
Platelets: 256 10*3/uL (ref 150–450)
RBC: 5.37 x10E6/uL — ABNORMAL HIGH (ref 3.77–5.28)
RDW: 12.6 % (ref 11.7–15.4)
WBC: 8.2 10*3/uL (ref 3.4–10.8)

## 2022-02-13 LAB — AMYLASE: Amylase: 31 U/L (ref 31–110)

## 2022-02-13 LAB — LIPASE: Lipase: 26 U/L (ref 14–72)

## 2022-02-13 NOTE — Therapy (Signed)
[facility] [place], [place], [address] Phone: [phone]   Fax:  [phone]  Physical Therapy Treatment  Patient Details  Name: Connie Rivera MRN: [id number] Date of Birth: [birth year] Referring Provider (PT): Darla Lesches   Encounter Date: 02/13/2022   PT End of Session - 02/13/22 1037     Visit Number 6    Number of Visits 8    Date for PT Re-Evaluation 02/19/22    Authorization Time Period 02-19-22    PT Start Time 1030    PT Stop Time 1114    PT Time Calculation (min) 44 min             Past Medical History:  Diagnosis Date   Amenorrhea 07/10/2015   Asthma    Depression    Irregular periods 07/10/2015   Obesity    Scoliosis    Sleep apnea     Past Surgical History:  Procedure Laterality Date   TONSILLECTOMY     TONSILLECTOMY AND ADENOIDECTOMY     TONSILLECTOMY AND ADENOIDECTOMY     TYMPANOSTOMY TUBE PLACEMENT      There were no vitals filed for this visit.   Subjective Assessment - 02/13/22 1037     Subjective Pt arrives for today's treatment session denying any pain.    Pertinent History Asthma, scoliosis, h/o left shoulder pain though patient reports she was having no pain at time of accident.    Patient Stated Goals Get out of pain.    Currently in Pain? No/denies    Pain Onset 1 to 4 weeks ago                               [facility] Adult PT Treatment/Exercise - 02/13/22 0001       Neck Exercises: Machines for Strengthening   UBE (Upper Arm Bike) 90 RPM x 10 mins (forward/backward)      Shoulder Exercises: Standing   Protraction Strengthening;Left;20 reps;Theraband;10 reps    Theraband Level (Shoulder Protraction) Level 3 (Green)    External Rotation Strengthening;Left;20 reps;Theraband;10 reps    Theraband Level (Shoulder External Rotation) Level 3 (Green)    Internal Rotation Strengthening;Left;20 reps;Theraband;10 reps    Theraband Level (Shoulder Internal  Rotation) Level 3 (Green)    Flexion Strengthening;Left;20 reps;Weights;10 reps    Shoulder Flexion Weight (lbs) 2    ABduction Strengthening;Left;20 reps;Weights;10 reps    Shoulder ABduction Weight (lbs) 2    Extension Strengthening;Left;20 reps;Theraband;10 reps    Theraband Level (Shoulder Extension) Level 3 (Green)    Row Strengthening;Left;20 reps;Theraband;10 reps    Theraband Level (Shoulder Row) Level 3 (Green)    Other Standing Exercises 3-way bicep curls 4# 15 reps each away      Shoulder Exercises: Pulleys   Flexion 5 minutes      Manual Therapy   Manual Therapy Soft tissue mobilization    Soft tissue mobilization STW/M to left upper trap and cervical spine to decrease pain and tone                          PT Long Term Goals - 02/06/22 1118       PT LONG TERM GOAL #1   Title Independent with an HEP.    Time 4    Period Weeks    Status On-going      PT LONG TERM GOAL #2   Title Perform  ADL's with pain not > 2-3/10.    Time 4    Period Weeks    Status Partially Met      PT LONG TERM GOAL #3   Title Use left UE as needed with no pain increase.    Time 4    Period Weeks    Status Partially Met                   Plan - 02/13/22 1037     Clinical Impression Statement Pt arrives for today's treatment session denying any shoulder pain or tightness.  Pt able to tolerate increase reps with standing shoulder strengthening exercises.  STW/M performed to left upper traps and paraspinals to decrease pain and tone with good results.  Pt denied any pain at completion of today's treatment session.    Personal Factors and Comorbidities Comorbidity 1    Comorbidities Asthma, scoliosis, h/o left shoulder pain though patient reports she was having no pain at time of accident.    Examination-Activity Limitations Other    Examination-Participation Restrictions Other    Rehab Potential Excellent    PT Frequency 2x / week    PT Duration 4 weeks    PT  Treatment/Interventions ADLs/Self Care Home Management;Therapeutic activities;Therapeutic exercise;Manual techniques;Patient/family education;Passive range of motion    PT Next Visit Plan STW/M and postural exercises, left RW4.             Patient will benefit from skilled therapeutic intervention in order to improve the following deficits and impairments:  Decreased activity tolerance, Pain, Increased muscle spasms, Decreased range of motion, Postural dysfunction  Visit Diagnosis: Cervicalgia  Abnormal posture  Chronic left shoulder pain  Muscle weakness (generalized)     Problem List Patient Active Problem List   Diagnosis Date Noted   Hepatic steatosis 02/12/2022   Liver lesion 02/12/2022   Dorsal cervical fat pad 02/24/2018   Insulin resistance 04/21/2017   Morbid obesity ([place]) 04/14/2017   Acanthosis nigricans, acquired 04/14/2017   Post-traumatic headache, not intractable 11/23/2016   GAD (generalized anxiety disorder) 10/22/2016   Recurrent major depressive disorder ([place]) 03/04/2016   PTSD (post-traumatic stress disorder) 02/03/2016   Rhinitis, allergic 11/12/2015   Irregular periods 07/10/2015   Morbid childhood obesity with BMI greater than 99th percentile for age [facility]) 07/10/2015   Amenorrhea 07/10/2015   Migraine without aura and without status migrainosus, not intractable 12/19/2014   Tension headache 12/19/2014    Kathrynn Ducking, PTA 02/13/2022, 11:14 AM  [facility] [place], [place], [address] Phone: [phone]   Fax:  [phone]  Name: Connie Rivera MRN: [id number] Date of Birth: May 08, 2001

## 2022-02-16 ENCOUNTER — Other Ambulatory Visit: Payer: Self-pay | Admitting: *Deleted

## 2022-02-16 ENCOUNTER — Ambulatory Visit: Payer: Medicaid Other

## 2022-02-16 ENCOUNTER — Other Ambulatory Visit: Payer: Self-pay

## 2022-02-16 DIAGNOSIS — Z5941 Food insecurity: Secondary | ICD-10-CM

## 2022-02-16 NOTE — Patient Instructions (Addendum)
Visit Information  Ms. Connie Rivera was given information about Medicaid Managed Care team care coordination services as a part of their Healthy [facility] Medicaid benefit. Connie Rivera verbally consented to engagement with the [facility] Managed Care team.   If you are experiencing a medical emergency, please call 911 or report to your local emergency department or urgent care.   If you have a non-emergency medical problem during routine business hours, please contact your provider's office and ask to speak with a nurse.   For questions related to your Healthy [facility] health plan, please call: [phone] or visit the homepage here: [email or web address]  If you would like to schedule transportation through your Healthy [facility] plan, please call the following number at least 2 days in advance of your appointment: [phone]  For information about your ride after you set it up, call Ride Assist at [phone]. Use this number to activate a Will Call pickup, or if your transportation is late for a scheduled pickup. Use this number, too, if you need to make a change or cancel a previously scheduled reservation.  If you need transportation services right away, call [phone]. The after-hours call center is staffed 24 hours to handle ride assistance and urgent reservation requests (including discharges) 365 days a year. Urgent trips include sick visits, hospital discharge requests and life-sustaining treatment.  Call the [facility] at [phone], at any time, 24 hours a day, 7 days a week. If you are in danger or need immediate medical attention call 911.  If you would like help to quit smoking, call 1-800-QUIT-NOW 563-561-1727) OR Espaol: 1-855-Djelo-Ya (6-144-315-4008) o para ms informacin haga clic aqu or Text READY to 200-400 to register via text  Ms. Connie Rivera,   Please see education materials related to [place] diet  provided by MyChart link.  Patient verbalizes understanding of instructions and care plan provided today and agrees to view in [place]. Active MyChart status confirmed with patient.    Telephone follow up appointment with Managed Medicaid care management team member scheduled for:03/19/22 @ 12:30pm  Connie Joiner RN, BSN [place] RN Care Coordinator   Following is a copy of your plan of care:  Care Plan : RN Care Manager Plan of Care  Updates made by Connie Montane, RN since 02/16/2022 12:00 AM     Problem: Health Management needs related to Fatty Liver Disease      Long-Range Goal: Development of Plan o Care to address Health Management needs related to Fatty Liver Disease   Start Date: 02/16/2022  Expected End Date: 04/17/2022  Priority: High  Note:   Current Barriers:  Knowledge Deficits related to plan of care for management of Fatty Liver Diseas   RNCM Clinical Goal(s):  Patient will verbalize understanding of plan for management of Fatty Liver Disease as evidenced by attending GI appointment and verbalization of self monitoring activities attend all scheduled medical appointments: 02/19/22 with GI and 03/18/22 with PCP as evidenced by provider documentation in EMR        continue to work with RN Care Manager and/or Social Worker to address care management and care coordination needs related to Fatty Liver Disease as evidenced by adherence to CM Team Scheduled appointments     work with pharmacist to address medication management related to Fatty Liver Disease as evidenced by review of EMR and patient or pharmacist report    work with community resource care guide to address needs related to Limited access  to food as evidenced by patient and/or community resource care guide support    through collaboration with Consulting civil engineer, provider, and care team.   Interventions: Inter-disciplinary care team collaboration (see longitudinal plan of care) Evaluation  of current treatment plan related to  self management and patient's adherence to plan as established by provider   Fatty Liver Disease  (Status: New goal.) Long Term Goal  Evaluation of current treatment plan related to  Fatty Liver Disease , Financial constraints related to food insecurity and Mental Health Concerns  self-management and patient's adherence to plan as established by provider. Discussed plans with patient for ongoing care management follow up and provided patient with direct contact information for care management team Provided education to patient re: mediterranean diet; Reviewed scheduled/upcoming provider appointments including GI referral 02/19/22 at 9:45am and 03/18/22 with PCP; Care Guide referral for assistance with food pantries; Pharmacy referral for medication management, patient having side effects with metformin; Discussed plans with patient for ongoing care management follow up and provided patient with direct contact information for care management team; Connie Rivera referral for managing anxiety  Patient Goals/Self-Care Activities: Take medications as prescribed   Attend all scheduled provider appointments Call provider office for new concerns or questions  Work on changing diet to include more fruits and vegetables, limiting sugar and fried foods

## 2022-02-16 NOTE — Patient Outreach (Signed)
Medicaid Managed Care   Nurse Care Manager Note  02/16/2022 Name:  Connie Rivera MRN:  [id number] DOB:  [birth year]  Connie Rivera is an 21 y.o. year old female who is a primary patient of Connie Perking, Rivera.  The [facility] Managed Care Coordination team was consulted for assistance with:    Fatty Liver Disease  [name] was given information about Medicaid Managed Care Coordination team services today. Connie Rivera Patient agreed to services and verbal consent obtained.  Engaged with patient by telephone for initial visit in response to provider referral for case management and/or care coordination services.   Assessments/Interventions:  Review of past medical history, allergies, medications, health status, including review of consultants reports, laboratory and other test data, was performed as part of comprehensive evaluation and provision of chronic care management services.  SDOH (Social Determinants of Health) assessments and interventions performed: SDOH Interventions    Flowsheet Row Most Recent Value  SDOH Interventions   Food Insecurity Interventions Other (Comment)  [Care Guide referral for food pantries]  Housing Interventions Intervention Not Indicated  Transportation Interventions Intervention Not Indicated       Care Plan  Allergies  Allergen Reactions   Hydrocodone Palpitations   Pineapple Anaphylaxis and Itching   Wound Dressing Adhesive Itching and Rash   Adhesive [Tape] Rash   Latex Rash    Medications Reviewed Today     Reviewed by Connie Montane, RN (Registered Nurse) on 02/16/22 at [place] List Status: <None>   Medication Order Taking? Sig Documenting Provider Last Dose Status Informant  amoxicillin (AMOXIL) 875 MG tablet [id number] Yes Take 1 tablet (875 mg total) by mouth 2 (two) times daily. 1 po BID Connie Rivera Taking Active   busPIRone (BUSPAR) 10 MG tablet [id number] Yes Take 1 tablet (10 mg total) by mouth 2 (two) times daily.  Connie Perking, Rivera Taking Active   medroxyPROGESTERone (PROVERA) 10 MG tablet [id number] No Take by mouth.  Patient not taking: Reported on 02/16/2022   [provider] Not Taking Active   metFORMIN (GLUCOPHAGE) 500 MG tablet [id number] Yes Take 500 mg by mouth 3 (three) times daily. [provider] Taking Active   ondansetron (ZOFRAN) 4 MG tablet [id number] Yes Take 4 mg by mouth every 8 (eight) hours as needed. [provider] Taking Active            Med Note Connie Rivera Jan 01, 2022 12:14 PM) Connie Rivera HFA 108 [phone] Base) MCG/ACT inhaler [id number] Yes Inhale into the lungs. [provider] Taking Active   tiZANidine (ZANAFLEX) 4 MG tablet [id number] No Take 1 tablet (4 mg total) by mouth every 6 (six) hours as needed for muscle spasms.  Patient not taking: Reported on 01/01/2022   Connie Perking, Rivera Not Taking Active   Vitamin D, Ergocalciferol, (DRISDOL) 1.25 MG (50000 UNIT) CAPS capsule [id number] Yes Take 50,000 Units by mouth once a week. [provider] Taking Active             Patient Active Problem List   Diagnosis Date Noted   Hepatic steatosis 02/12/2022   Liver lesion 02/12/2022   Dorsal cervical fat pad 02/24/2018   Insulin resistance 04/21/2017   Morbid obesity ([place]) 04/14/2017   Acanthosis nigricans, acquired 04/14/2017   Post-traumatic headache, not intractable 11/23/2016   GAD (generalized anxiety disorder) 10/22/2016   Recurrent major depressive disorder ([place]) 03/04/2016   PTSD (post-traumatic stress  disorder) 02/03/2016   Rhinitis, allergic 11/12/2015   Irregular periods 07/10/2015   Morbid childhood obesity with BMI greater than 99th percentile for age [facility]) 07/10/2015   Amenorrhea 07/10/2015   Migraine without aura and without status migrainosus, not intractable 12/19/2014   Tension headache 12/19/2014    Conditions to be addressed/monitored per PCP order:   Fatty Liver Disease  Care Plan  : [place] of Care  Updates made by Connie Montane, RN since 02/16/2022 12:00 AM     Problem: Health Management needs related to Fatty Liver Disease      Long-Range Goal: Development of Plan o Care to address Health Management needs related to Fatty Liver Disease   Start Date: 02/16/2022  Expected End Date: 04/17/2022  Priority: High  Note:   Current Barriers:  Knowledge Deficits related to plan of care for management of Fatty Liver Diseas   RNCM Clinical Goal(s):  Patient will verbalize understanding of plan for management of Fatty Liver Disease as evidenced by attending GI appointment and verbalization of self monitoring activities attend all scheduled medical appointments: 02/19/22 with GI and 03/18/22 with PCP as evidenced by provider documentation in EMR        continue to work with RN Care Manager and/or Social Worker to address care management and care coordination needs related to Fatty Liver Disease as evidenced by adherence to CM Team Scheduled appointments     work with pharmacist to address medication management related to Fatty Liver Disease as evidenced by review of EMR and patient or pharmacist report    work with community resource care guide to address needs related to Limited access to food as evidenced by patient and/or community resource care guide support    through collaboration with Consulting civil engineer, provider, and care team.   Interventions: Inter-disciplinary care team collaboration (see longitudinal plan of care) Evaluation of current treatment plan related to  self management and patient's adherence to plan as established by provider   Fatty Liver Disease  (Status: New goal.) Long Term Goal  Evaluation of current treatment plan related to  Fatty Liver Disease , Financial constraints related to food insecurity and Mental Health Concerns  self-management and patient's adherence to plan as established by provider. Discussed plans with patient for ongoing care  management follow up and provided patient with direct contact information for care management team Provided education to patient re: mediterranean diet; Reviewed scheduled/upcoming provider appointments including GI referral 02/19/22 at 9:45am and 03/18/22 with PCP; Care Guide referral for assistance with food pantries; Pharmacy referral for medication management, patient having side effects with metformin; Discussed plans with patient for ongoing care management follow up and provided patient with direct contact information for care management team; LCSW referral for managing anxiety  Patient Goals/Self-Care Activities: Take medications as prescribed   Attend all scheduled provider appointments Call provider office for new concerns or questions  Work on changing diet to include more fruits and vegetables, limiting sugar and fried foods       Follow Up:  Patient agrees to Care Plan and Follow-up.  Plan: The Managed Medicaid care management team will reach out to the patient again over the next 30 days.  Date/time of next scheduled RN care management/care coordination outreach:  03/19/22 @ 12:30pm  Lurena Joiner RN, BSN [place] RN Care Coordinator

## 2022-02-17 ENCOUNTER — Other Ambulatory Visit: Payer: Medicaid Other

## 2022-02-17 ENCOUNTER — Other Ambulatory Visit: Payer: Self-pay | Admitting: Family Medicine

## 2022-02-17 ENCOUNTER — Encounter: Payer: Self-pay | Admitting: Family Medicine

## 2022-02-17 ENCOUNTER — Telehealth: Payer: Self-pay

## 2022-02-17 DIAGNOSIS — R748 Abnormal levels of other serum enzymes: Secondary | ICD-10-CM | POA: Diagnosis not present

## 2022-02-17 NOTE — Telephone Encounter (Signed)
° °  Telephone encounter was:  Unsuccessful.  02/17/2022 Name: Connie Rivera MRN: [id number] DOB: 2001/03/02  Unsuccessful outbound call made today to assist with:  Food Insecurity  Outreach Attempt:  1st Attempt  A HIPAA compliant voice message was left requesting a return call.  Instructed patient to call back at  earliest convenience.   [place], Care Management  [phone] [address], [place], [place] [address] Phone: [phone] Email: [email or web address]

## 2022-02-17 NOTE — Telephone Encounter (Signed)
° °  Telephone encounter was:  Successful.  02/17/2022 Name: Connie Rivera MRN: [id number] DOB: 2001-01-19  Connie Rivera is a 21 y.o. year old female who is a primary care patient of Gwenlyn Perking, FNP . The community resource team was consulted for assistance with [place] guide performed the following interventions: Patient provided with information about care guide support team and interviewed to confirm resource needs.Patient has EBT but will be reduced next month. I will be mailing resources   Follow Up Plan:  Care guide will follow up with patient by phone over the next week    [place], Care Management  [phone] [address], [place], [place] [address] Phone: [phone] Email: [email or web address]

## 2022-02-18 LAB — IRON,TIBC AND FERRITIN PANEL
Ferritin: 130 ng/mL (ref 15–150)
Iron Saturation: 20 % (ref 15–55)
Iron: 65 ug/dL (ref 27–159)
Total Iron Binding Capacity: 332 ug/dL (ref 250–450)
UIBC: 267 ug/dL (ref 131–425)

## 2022-02-18 NOTE — Telephone Encounter (Signed)
Scheduled 02/27/22 with SW  ? ?Laverda Sorenson  ?Care Guide, Embedded Care Coordination  High Risk Medicaid Managed Care  ?[place]  ?Direct Dial: [phone]  ?

## 2022-02-19 DIAGNOSIS — R7989 Other specified abnormal findings of blood chemistry: Secondary | ICD-10-CM | POA: Diagnosis not present

## 2022-02-19 DIAGNOSIS — K760 Fatty (change of) liver, not elsewhere classified: Secondary | ICD-10-CM | POA: Diagnosis not present

## 2022-02-20 ENCOUNTER — Encounter: Payer: Self-pay | Admitting: Family Medicine

## 2022-02-20 ENCOUNTER — Ambulatory Visit: Payer: Medicaid Other | Admitting: Physical Therapy

## 2022-02-20 ENCOUNTER — Telehealth: Payer: Self-pay | Admitting: Family Medicine

## 2022-02-20 ENCOUNTER — Ambulatory Visit: Payer: Medicaid Other | Admitting: Family Medicine

## 2022-02-20 ENCOUNTER — Other Ambulatory Visit: Payer: Self-pay

## 2022-02-20 DIAGNOSIS — F411 Generalized anxiety disorder: Secondary | ICD-10-CM

## 2022-02-20 DIAGNOSIS — R293 Abnormal posture: Secondary | ICD-10-CM | POA: Insufficient documentation

## 2022-02-20 DIAGNOSIS — G8929 Other chronic pain: Secondary | ICD-10-CM | POA: Insufficient documentation

## 2022-02-20 DIAGNOSIS — M6281 Muscle weakness (generalized): Secondary | ICD-10-CM | POA: Insufficient documentation

## 2022-02-20 DIAGNOSIS — M542 Cervicalgia: Secondary | ICD-10-CM | POA: Insufficient documentation

## 2022-02-20 DIAGNOSIS — M25512 Pain in left shoulder: Secondary | ICD-10-CM | POA: Insufficient documentation

## 2022-02-20 MED ORDER — BUSPIRONE HCL 10 MG PO TABS: 10.0000 mg | ORAL_TABLET | Freq: Two times a day (BID) | ORAL | 1 refills | Status: DC

## 2022-02-20 NOTE — Progress Notes (Signed)
? ?Virtual Visit via telephone Note ?Due to COVID-19 pandemic this visit was conducted virtually. This visit type was conducted due to national recommendations for restrictions regarding the COVID-19 Pandemic (e.g. social distancing, sheltering in place) in an effort to limit this patient's exposure and mitigate transmission in our community. All issues noted in this document were discussed and addressed.  A physical exam was not performed with this format.  ? ?I connected with Connie Rivera on 02/20/2022 at 1105 by telephone and verified that I am speaking with the correct person using two identifiers. Connie Rivera is currently located at home and family is currently with them during visit. The provider, Monia Pouch, FNP is located in their office at time of visit. ? ?I discussed the limitations, risks, security and privacy concerns of performing an evaluation and management service by virtual visit and the availability of in person appointments. I also discussed with the patient that there may be a patient responsible charge related to this service. The patient expressed understanding and agreed to proceed. ? ?Subjective:  ?Patient ID: Connie Rivera, female    DOB: 18-Dec-2001, 21 y.o.   MRN: [id number] ? ?Chief Complaint:  Medication Refill ? ? ?HPI: ?Connie Rivera is a 21 y.o. female presenting on 02/20/2022 for Medication Refill ? ? ?Pt states she needs a refill of her Buspar. She knows her PCP is out on leave but needs a refill prior to her returning to office. She states she is doing very well on current dosing. No adverse side effects.  ? ?GAD 7 : Generalized Anxiety Score 02/20/2022 02/12/2022 01/01/2022 12/17/2021  ?Nervous, Anxious, on Edge 1 3 2 3   ?Control/stop worrying 1 3 1 3   ?Worry too much - different things 1 3 1 3   ?Trouble relaxing 1 3 1 2   ?Restless 0 2 0 2  ?Easily annoyed or irritable 1 2 0 3  ?Afraid - awful might happen 1 3 1 3   ?Total GAD 7 Score 6 19 6 19   ?Anxiety Difficulty - - Somewhat  difficult Very difficult  ? ? ?Depression screen [facility] 2/9 02/20/2022 02/12/2022 01/01/2022 12/17/2021 12/10/2021  ?Decreased Interest 0 0 0 2 0  ?Down, Depressed, Hopeless 1 1 1 2 2   ?PHQ - 2 Score 1 1 1 4 2   ?Altered sleeping 1 2 1 2 2   ?Tired, decreased energy 1 0 1 2 2   ?Change in appetite 0 0 0 0 1  ?Feeling bad or failure about yourself  0 2 1 2 3   ?Trouble concentrating 0 2 0 0 0  ?Moving slowly or fidgety/restless 0 0 0 0 0  ?Suicidal thoughts 0 0 0 0 0  ?PHQ-9 Score 3 7 4 10 10   ?Difficult doing work/chores - Very difficult Somewhat difficult Somewhat difficult Somewhat difficult  ?Some recent data might be hidden  ? ? ? ?Relevant past medical, surgical, family, and social history reviewed and updated as indicated.  ?Allergies and medications reviewed and updated. ? ? ?Past Medical History:  ?Diagnosis Date  ? Amenorrhea 07/10/2015  ? Asthma   ? Depression   ? Irregular periods 07/10/2015  ? Obesity   ? Scoliosis   ? Sleep apnea   ? ? ?Past Surgical History:  ?Procedure Laterality Date  ? TONSILLECTOMY    ? TONSILLECTOMY AND ADENOIDECTOMY    ? TONSILLECTOMY AND ADENOIDECTOMY    ? TYMPANOSTOMY TUBE PLACEMENT    ? ? ?Social History  ? ?Socioeconomic History  ? Marital status: Married  ?  Spouse name: Not on file  ? Number of children: Not on file  ? Years of education: Not on file  ? Highest education level: Not on file  ?Occupational History  ? Not on file  ?Tobacco Use  ? Smoking status: Light Smoker  ?  Types: Cigarettes  ? Smokeless tobacco: Never  ? Tobacco comments:  ?  Patient is vaping off and on.  Not daily  ?Vaping Use  ? Vaping Use: Never used  ?Substance and Sexual Activity  ? Alcohol use: No  ? Drug use: No  ? Sexual activity: Yes  ?  Birth control/protection: Condom, Implant  ?Other Topics Concern  ? Not on file  ?Social History Narrative  ? Vaidehi is a 10th grade student.  ? She attends [employer].  ? She lives with both parents and her cousin.  ? She enjoys basketball, hanging with friends,  and reading.  ? ?Social Determinants of Health  ? ?Financial Resource Strain: Not on file  ?Food Insecurity: Food Insecurity Present  ? Worried About Charity fundraiser in the Last Year: Sometimes true  ? Ran Out of Food in the Last Year: Sometimes true  ?Transportation Needs: No Transportation Needs  ? Lack of Transportation (Medical): No  ? Lack of Transportation (Non-Medical): No  ?Physical Activity: Not on file  ?Stress: Not on file  ?Social Connections: Not on file  ?Intimate Partner Violence: Not on file  ? ? ?Outpatient Encounter Medications as of 02/20/2022  ?Medication Sig  ? amoxicillin (AMOXIL) 875 MG tablet Take 1 tablet (875 mg total) by mouth 2 (two) times daily. 1 po BID  ? busPIRone (BUSPAR) 10 MG tablet Take 1 tablet (10 mg total) by mouth 2 (two) times daily.  ? medroxyPROGESTERone (PROVERA) 10 MG tablet Take by mouth. (Patient not taking: Reported on 02/16/2022)  ? metFORMIN (GLUCOPHAGE) 500 MG tablet Take 500 mg by mouth 3 (three) times daily.  ? ondansetron (ZOFRAN) 4 MG tablet Take 4 mg by mouth every 8 (eight) hours as needed.  ? PROAIR HFA 108 (90 Base) MCG/ACT inhaler Inhale into the lungs.  ? tiZANidine (ZANAFLEX) 4 MG tablet Take 1 tablet (4 mg total) by mouth every 6 (six) hours as needed for muscle spasms. (Patient not taking: Reported on 01/01/2022)  ? Vitamin D, Ergocalciferol, (DRISDOL) 1.25 MG (50000 UNIT) CAPS capsule Take 50,000 Units by mouth once a week.  ? [DISCONTINUED] busPIRone (BUSPAR) 10 MG tablet Take 1 tablet (10 mg total) by mouth 2 (two) times daily.  ? ?No facility-administered encounter medications on file as of 02/20/2022.  ? ? ?Allergies  ?Allergen Reactions  ? Hydrocodone Palpitations  ? Pineapple Anaphylaxis and Itching  ? Wound Dressing Adhesive Itching and Rash  ? Adhesive [Tape] Rash  ? Latex Rash  ? ? ?ROS per HPI, all others negative.  ?   ? ? ?Observations/Objective: ?No vital signs or physical exam, this was a virtual health encounter.  ?Pt alert and oriented,  answers all questions appropriately, and able to speak in full sentences.  ? ? ?Assessment and Plan: ?Connie Rivera was seen today for medication refill. ? ?Diagnoses and all orders for this visit: ? ?GAD (generalized anxiety disorder) ?Doing well on current dosing, will refill. Pt aware to follow up with PCP as scheduled.  ?-     busPIRone (BUSPAR) 10 MG tablet; Take 1 tablet (10 mg total) by mouth 2 (two) times daily. ? ? ? ? ?Follow Up Instructions: ?Return if symptoms worsen or  fail to improve. ? ?  ?I discussed the assessment and treatment plan with the patient. The patient was provided an opportunity to ask questions and all were answered. The patient agreed with the plan and demonstrated an understanding of the instructions. ?  ?The patient was advised to call back or seek an in-person evaluation if the symptoms worsen or if the condition fails to improve as anticipated. ? ?The above assessment and management plan was discussed with the patient. The patient verbalized understanding of and has agreed to the management plan. Patient is aware to call the clinic if they develop any new symptoms or if symptoms persist or worsen. Patient is aware when to return to the clinic for a follow-up visit. Patient educated on when it is appropriate to go to the emergency department.  ? ? ?I provided 15 minutes of time during this telephone encounter. ? ? ?Monia Pouch, FNP-C ?[facility] ?[address] ?[place], [place] [address] ?[phone] ?02/20/2022 ? ? ?

## 2022-02-20 NOTE — Therapy (Addendum)
[place] ?[facility] ?[place] ?[place], [place], [address] ?Phone: [phone]   Fax:  [phone] ? ?Patient Details  ?Name: Connie Rivera ?MRN: [id number] ?Date of Birth: November 19, 2001 ?Referring Provider:  Gwenlyn Perking, FNP ? ?Encounter Date: 02/20/2022 ? ?Patient to be discharged today as medicaid authorization period has expired. Patient able to achieve or partially achieve most goals. As patient had not been given an HEP prior to today, and HEP along with green and blue theraband was provided to continue postural strengthening at home. Patient has currently been busy with multiple medical appointments. Patient also indicated some L scapular discomfort today that occurs intermittantly.  ? ?Standley Brooking, PTA ?02/20/2022, 10:52 AM ? ?[place] ?[facility] ?[place] ?[place], [place], [address] ?Phone: [phone]   Fax:  [phone] ?

## 2022-02-20 NOTE — Telephone Encounter (Signed)
Prescription has already been sent in,  patient aware. ?

## 2022-02-20 NOTE — Telephone Encounter (Signed)
?  Prescription Request ? ?02/20/2022 ? ?Is this a "Controlled Substance" medicine? no ? ?Have you seen your PCP in the last 2 weeks? Patient has appt today. Wanted a message put in because she did not want to run out and said that she will run out of medication today.  ? ?If YES, route message to pool  -  If NO, patient needs to be scheduled for appointment. ? ?What is the name of the medication or equipment? busPIRone (BUSPAR) 10 MG tablet ? ?Have you contacted your pharmacy to request a refill? no  ? ?Which pharmacy would you like this sent to? [place], [place] - [address] (Ph: [phone]) ? ? ?Patient notified that their request is being sent to the clinical staff for review and that they should receive a response within 2 business days.  ?  ?

## 2022-02-23 DIAGNOSIS — R1030 Lower abdominal pain, unspecified: Secondary | ICD-10-CM | POA: Diagnosis not present

## 2022-02-23 DIAGNOSIS — R7989 Other specified abnormal findings of blood chemistry: Secondary | ICD-10-CM | POA: Diagnosis not present

## 2022-02-23 DIAGNOSIS — K760 Fatty (change of) liver, not elsewhere classified: Secondary | ICD-10-CM | POA: Diagnosis not present

## 2022-02-23 DIAGNOSIS — R1031 Right lower quadrant pain: Secondary | ICD-10-CM | POA: Diagnosis not present

## 2022-02-25 DIAGNOSIS — E282 Polycystic ovarian syndrome: Secondary | ICD-10-CM | POA: Diagnosis not present

## 2022-02-25 DIAGNOSIS — E559 Vitamin D deficiency, unspecified: Secondary | ICD-10-CM | POA: Diagnosis not present

## 2022-02-25 DIAGNOSIS — Z6841 Body Mass Index (BMI) 40.0 and over, adult: Secondary | ICD-10-CM | POA: Diagnosis not present

## 2022-02-26 DIAGNOSIS — Z6841 Body Mass Index (BMI) 40.0 and over, adult: Secondary | ICD-10-CM | POA: Diagnosis not present

## 2022-02-26 DIAGNOSIS — R1031 Right lower quadrant pain: Secondary | ICD-10-CM | POA: Diagnosis not present

## 2022-02-26 DIAGNOSIS — J020 Streptococcal pharyngitis: Secondary | ICD-10-CM | POA: Diagnosis not present

## 2022-02-26 DIAGNOSIS — E669 Obesity, unspecified: Secondary | ICD-10-CM | POA: Diagnosis not present

## 2022-02-27 ENCOUNTER — Ambulatory Visit (INDEPENDENT_AMBULATORY_CARE_PROVIDER_SITE_OTHER): Payer: Medicaid Other | Admitting: Nurse Practitioner

## 2022-02-27 ENCOUNTER — Encounter: Payer: Self-pay | Admitting: Nurse Practitioner

## 2022-02-27 ENCOUNTER — Ambulatory Visit: Payer: Medicaid Other | Admitting: Family Medicine

## 2022-02-27 ENCOUNTER — Other Ambulatory Visit: Payer: Self-pay | Admitting: Licensed Clinical Social Worker

## 2022-02-27 DIAGNOSIS — J029 Acute pharyngitis, unspecified: Secondary | ICD-10-CM

## 2022-02-27 DIAGNOSIS — J301 Allergic rhinitis due to pollen: Secondary | ICD-10-CM

## 2022-02-27 MED ORDER — CETIRIZINE HCL 10 MG PO TABS: 10.0000 mg | ORAL_TABLET | Freq: Every day | ORAL | 1 refills | Status: DC

## 2022-02-27 NOTE — Progress Notes (Signed)
? ?  Virtual Visit  Note ?Due to COVID-19 pandemic this visit was conducted virtually. This visit type was conducted due to national recommendations for restrictions regarding the COVID-19 Pandemic (e.g. social distancing, sheltering in place) in an effort to limit this patient's exposure and mitigate transmission in our community. All issues noted in this document were discussed and addressed.  A physical exam was not performed with this format. ? ?I connected with Connie Rivera on 02/27/22 at 12:20 PM by telephone and verified that I am speaking with the correct person using two identifiers. Connie Rivera is currently located at home  during visit. The provider, Ivy Lynn, NP is located in their office at time of visit. ? ?I discussed the limitations, risks, security and privacy concerns of performing an evaluation and management service by telephone and the availability of in person appointments. I also discussed with the patient that there may be a patient responsible charge related to this service. The patient expressed understanding and agreed to proceed. ? ? ?History and Present Illness: ? ?Sore Throat  ?This is a new problem. The current episode started yesterday. The problem has been unchanged. Neither side of throat is experiencing more pain than the other. There has been no fever. The pain is moderate. Associated symptoms include congestion and coughing. Pertinent negatives include no ear pain or headaches. She has had exposure to strep. Treatments tried: antibiotic from ED. The treatment provided mild relief.  ?Sinus Problem ?This is a new problem. The current episode started yesterday. The problem is unchanged. There has been no fever. She is experiencing no pain. Associated symptoms include congestion, coughing and sneezing. Pertinent negatives include no chills, ear pain or headaches.  ? ? ? ?Review of Systems  ?Constitutional:  Negative for chills and fever.  ?HENT:  Positive for congestion and  sneezing. Negative for ear pain.   ?Respiratory:  Positive for cough.   ?Skin:  Negative for rash.  ?Neurological:  Negative for headaches.  ?All other systems reviewed and are negative. ? ? ?Observations/Objective: ?Tele- visit patient not in distress ? ?Assessment and Plan: ?Positive strep from ED ?Take meds as prescribed ?- Use a cool mist humidifier  ?-Use saline nose sprays frequently ?-Force fluids ?-For fever or aches or pains- take Tylenol or ibuprofen. ?-If symptoms do not improve, she may need to be COVID tested to rule this out ?Follow up with worsening unresolved symptoms  ? ?Follow Up Instructions: ?Follow up with worsening unresolved symptoms ? ?  ?I discussed the assessment and treatment plan with the patient. The patient was provided an opportunity to ask questions and all were answered. The patient agreed with the plan and demonstrated an understanding of the instructions. ?  ?The patient was advised to call back or seek an in-person evaluation if the symptoms worsen or if the condition fails to improve as anticipated. ? ?The above assessment and management plan was discussed with the patient. The patient verbalized understanding of and has agreed to the management plan. Patient is aware to call the clinic if symptoms persist or worsen. Patient is aware when to return to the clinic for a follow-up visit. Patient educated on when it is appropriate to go to the emergency department.  ? ?Time call ended:  12:31 pm  ? ?I provided 11 minutes of  non face-to-face time during this encounter. ? ? ? ?Ivy Lynn, NP ?  ?

## 2022-03-02 ENCOUNTER — Telehealth: Payer: Medicaid Other | Admitting: Family Medicine

## 2022-03-02 DIAGNOSIS — B3731 Acute candidiasis of vulva and vagina: Secondary | ICD-10-CM | POA: Diagnosis not present

## 2022-03-02 DIAGNOSIS — R059 Cough, unspecified: Secondary | ICD-10-CM | POA: Diagnosis not present

## 2022-03-03 NOTE — Patient Instructions (Signed)
Visit Information ? ?Ms. Szuch was given information about Medicaid Managed Care team care coordination services as a part of their [facility] Medicaid benefit. [name] verbally consented to engagement with the [facility] Managed Care team.  ? ?If you are experiencing a medical emergency, please call 911 or report to your local emergency department or urgent care.  ? ?For questions related to your [facility] health plan, please call: [phone] or visit the homepage here: [email or web address] ?  ?If you would like to schedule transportation through your [facility] plan, please call the following number at least 2 days in advance of your appointment: [phone] ?            For information about your ride after you set it up, call Ride Assist at [phone]. Use this number to activate a Will Call pickup, or if your transportation is late for a scheduled pickup. Use this number, too, if you need to make a change or cancel a previously scheduled reservation. ?            If you need transportation services right away, call [phone]. The after-hours call center is staffed 24 hours to handle ride assistance and urgent reservation requests (including discharges) 365 days a year. Urgent trips include sick visits, hospital discharge requests and life-sustaining treatment. ?  ?Call the [place] at [phone], at any time, 24 hours a day, 7 days a week. If you are in danger or need immediate medical attention call 911. ? ?If you would like help to quit smoking, call 1-800-QUIT-NOW 862-887-5884) OR Espa?ol: 1-855-D?jelo-Ya (845)295-6188) o para m?s informaci?n haga clic aqu? or Text READY to 200-400 to register via text ? ?Ms. Judeth Horn - following are the goals we discussed in your visit today:  ? Goals Addressed   ? ?  ?  ?  ?  ? This Visit's Progress  ?  Manage My Emotions   On track  ?  Patient Goals/Self-Care Activities: Over the  next 120 days ?Attend scheduled medical appointments ?Utilize healthy coping skills and/or supportive resources discussed ?Contact PCP office with any questions or concerns ?  ? ?  ? ? ?Patient verbalizes understanding of instructions and care plan provided today and agrees to view in [place]. Active MyChart status confirmed with patient.   ? ?Licensed [place] representative will follow up in 4-6 weeks ? ?Christa See, MSW, LCSW ?[facility] Care Management ?[facility] ?[email or web address] ?Phone [phone] ?6:46 AM ? ? ?Following is a copy of your plan of care:  ?Care Plan : [place]  ?Updates made by Rebekah Chesterfield, LCSW since 03/03/2022 12:00 AM  ?  ? ?Problem: Coping Skills (General Plan of Care)   ?  ? ?Long-Range Goal: Coping Skills Enhanced   ?Start Date: 02/27/2022  ?This Visit's Progress: On track  ?Priority: High  ?Note:   ?Current barriers:   ?Severe Persistent Mental Health needs related to PTSD, MDD, Anxiety ?Needs Support, Education, and Care Coordination in order to meet unmet mental health needs. ?Clinical Goal(s): verbalize understanding of plan for management of Anxiety, Depression, and PTSD   ?Clinical Interventions:  ?Assessed patient's previous and current treatment, coping skills, support system and barriers to care  ?Patient reports difficulty managing mental health conditions triggered by stress from recent health concerns, which resulted in surgery ?Patient reports compliance with med management. She is interested in establishing therapy; however, concerned about medicaid ending in a few months causing  risk of obtaining a new therapist. LCSW agreed to mail local counseling resources (preference of female provider) that provides televisits ?LCSW collaborated with RNCM to inform her that per pt, she hasn't received discussed resources  ?Solution-Focused Strategies employed:  ?Optician, dispensing provided ?Active listening / Reflection  utilized  ?Emotional Support Provided ?Verbalization of feelings encouraged  ?Crisis Catering manager / information provided  ; ?Review resources, discussed options and provided patient information about  ?counseling ?1:1 collaboration with primary care provider regarding development and update of comprehensive plan of care as evidenced by provider attestation and co-signature ?Inter-disciplinary care team collaboration (see longitudinal plan of care) ?Patient Goals/Self-Care Activities: Over the next 120 days ?Attend scheduled medical appointments ?Utilize healthy coping skills and/or supportive resources discussed ?Contact PCP office with any questions or concerns ? ?  ?  ?

## 2022-03-03 NOTE — Patient Outreach (Signed)
?Medicaid Managed Care ?Social Work Note ? ?03/03/2022 ?Name:  Connie Rivera MRN:  [id number] DOB:  29-Feb-2001 ? ?Connie Rivera is an 21 y.o. year old female who is a primary patient of Gwenlyn Perking, FNP.  The [facility] Managed Care Coordination team was consulted for assistance with:  Mental Health Counseling and Resources ? ?Connie Rivera was given information about Medicaid Managed Care Coordination team services today. [name] Patient agreed to services and verbal consent obtained. ? ?Engaged with patient  for by telephone forinitial visit in response to referral for case management and/or care coordination services.  ? ?Assessments/Interventions:  Review of past medical history, allergies, medications, health status, including review of consultants reports, laboratory and other test data, was performed as part of comprehensive evaluation and provision of chronic care management services. ? ?SDOH: (Social Determinant of Health) assessments and interventions performed: ? ? ?Advanced Directives Status:  Not addressed in this encounter. ? ?Care Plan ?                ?Allergies  ?Allergen Reactions  ? Hydrocodone Palpitations  ? Pineapple Anaphylaxis and Itching  ? Wound Dressing Adhesive Itching and Rash  ? Adhesive [Tape] Rash  ? Latex Rash  ? ? ?Medications Reviewed Today   ? ? Reviewed by Ivy Lynn, NP (Nurse Practitioner) on 02/27/22 at [place] List Status: <None>  ? ?Medication Order Taking? Sig Documenting Provider Last Dose Status Informant  ?amoxicillin (AMOXIL) 875 MG tablet [id number] No Take 1 tablet (875 mg total) by mouth 2 (two) times daily. 1 po BID Baruch Gouty, FNP Taking Active   ?busPIRone (BUSPAR) 10 MG tablet [id number]  Take 1 tablet (10 mg total) by mouth 2 (two) times daily. Baruch Gouty, FNP  Active   ?cetirizine (ZYRTEC ALLERGY) 10 MG tablet [id number] Yes Take 1 tablet (10 mg total) by mouth daily. Ivy Lynn, NP  Active   ?medroxyPROGESTERone (PROVERA) 10 MG tablet  [id number] No Take by mouth.  ?Patient not taking: Reported on 02/16/2022  ? [provider] Not Taking Active   ?metFORMIN (GLUCOPHAGE) 500 MG tablet [id number] No Take 500 mg by mouth 3 (three) times daily. [provider] Taking Active   ?ondansetron (ZOFRAN) 4 MG tablet [id number] No Take 4 mg by mouth every 8 (eight) hours as needed. [provider] Taking Active   ?         ?Med Note Haywood Filler Jan 01, 2022 12:14 PM) Prn ?  ?PROAIR HFA 108 (90 Base) MCG/ACT inhaler [id number] No Inhale into the lungs. [provider] Taking Active   ?tiZANidine (ZANAFLEX) 4 MG tablet [id number] No Take 1 tablet (4 mg total) by mouth every 6 (six) hours as needed for muscle spasms.  ?Patient not taking: Reported on 01/01/2022  ? Gwenlyn Perking, FNP Not Taking Active   ?Vitamin D, Ergocalciferol, (DRISDOL) 1.25 MG (50000 UNIT) CAPS capsule [id number] No Take 50,000 Units by mouth once a week. [provider] Taking Active   ? ?  ?  ? ?  ? ? ?Patient Active Problem List  ? Diagnosis Date Noted  ? Hepatic steatosis 02/12/2022  ? Liver lesion 02/12/2022  ? Dorsal cervical fat pad 02/24/2018  ? Insulin resistance 04/21/2017  ? Morbid obesity ([place]) 04/14/2017  ? Acanthosis nigricans, acquired 04/14/2017  ? Post-traumatic headache, not intractable 11/23/2016  ? GAD (generalized anxiety disorder) 10/22/2016  ? Recurrent major depressive disorder ([place]) 03/04/2016  ?  PTSD (post-traumatic stress disorder) 02/03/2016  ? Rhinitis, allergic 11/12/2015  ? Irregular periods 07/10/2015  ? Morbid childhood obesity with BMI greater than 99th percentile for age [facility]) 07/10/2015  ? Amenorrhea 07/10/2015  ? Migraine without aura and without status migrainosus, not intractable 12/19/2014  ? Tension headache 12/19/2014  ? ? ?Conditions to be addressed/monitored per PCP order:  Anxiety, Depression, and PTSD ? ?Care Plan : LCSW Plan of Care  ?Updates made by Rebekah Chesterfield, LCSW since 03/03/2022  12:00 AM  ?  ? ?Problem: Coping Skills (General Plan of Care)   ?  ? ?Long-Range Goal: Coping Skills Enhanced   ?Start Date: 02/27/2022  ?This Visit's Progress: On track  ?Priority: High  ?Note:   ?Current barriers:   ?Severe Persistent Mental Health needs related to PTSD, MDD, Anxiety ?Needs Support, Education, and Care Coordination in order to meet unmet mental health needs. ?Clinical Goal(s): verbalize understanding of plan for management of Anxiety, Depression, and PTSD   ?Clinical Interventions:  ?Assessed patient's previous and current treatment, coping skills, support system and barriers to care  ?Patient reports difficulty managing mental health conditions triggered by stress from recent health concerns, which resulted in surgery ?Patient reports compliance with med management. She is interested in establishing therapy; however, concerned about medicaid ending in a few months causing risk of obtaining a new therapist. LCSW agreed to mail local counseling resources (preference of female provider) that provides televisits ?LCSW collaborated with RNCM to inform her that per pt, she hasn't received discussed resources  ?Solution-Focused Strategies employed:  ?Optician, dispensing provided ?Active listening / Reflection utilized  ?Emotional Support Provided ?Verbalization of feelings encouraged  ?Crisis Catering manager / information provided  ; ?Review resources, discussed options and provided patient information about  ?counseling ?1:1 collaboration with primary care provider regarding development and update of comprehensive plan of care as evidenced by provider attestation and co-signature ?Inter-disciplinary care team collaboration (see longitudinal plan of care) ?Patient Goals/Self-Care Activities: Over the next 120 days ?Attend scheduled medical appointments ?Utilize healthy coping skills and/or supportive resources discussed ?Contact PCP office with any questions or concerns ? ?  ? ? ?Follow  up:  Patient agrees to Care Plan and Follow-up. ? ?Plan: The Managed Medicaid care management team will reach out to the patient again over the next 4-6 weeks. ? ?Date/time of next scheduled Social Work care management/care coordination outreach:  TBD ? ?Christa See, MSW, LCSW ?[facility] Care Management ?[facility] ?[email or web address] ?Phone [phone] ?6:43 AM ? ?

## 2022-03-05 ENCOUNTER — Ambulatory Visit (INDEPENDENT_AMBULATORY_CARE_PROVIDER_SITE_OTHER): Payer: Medicaid Other | Admitting: Family Medicine

## 2022-03-05 ENCOUNTER — Encounter: Payer: Self-pay | Admitting: Family Medicine

## 2022-03-05 ENCOUNTER — Telehealth: Payer: Self-pay

## 2022-03-05 VITALS — BP 120/67 | HR 84 | Temp 97.5°F | Ht 70.0 in | Wt 335.2 lb

## 2022-03-05 DIAGNOSIS — B3731 Acute candidiasis of vulva and vagina: Secondary | ICD-10-CM | POA: Diagnosis not present

## 2022-03-05 DIAGNOSIS — J020 Streptococcal pharyngitis: Secondary | ICD-10-CM | POA: Diagnosis not present

## 2022-03-05 NOTE — Patient Instructions (Signed)
Pt aware to complete course of antibiotics and then take last diflucan 3 days after completion of antibiotic therapy. Verbalized understanding.  ?

## 2022-03-05 NOTE — Progress Notes (Signed)
?  ? ?Subjective:  ?Patient ID: Connie Rivera, female    DOB: 2001-03-06, 21 y.o.   MRN: [id number] ? ?Patient Care Team: ?Gwenlyn Perking, FNP as PCP - General (Family Medicine)  ? ?Chief Complaint:  Vaginitis ? ? ?HPI: ?[name] is a 21 y.o. female presenting on 03/05/2022 for Vaginitis ? ? ?Pt presents today for strep and vaginitis. She is currently on Augmentin for strep pharyngitis and still has 3 days left of therapy. She developed vaginitis and was given diflucan. She has taken one pill and has her second dose left. She states the first pill helped slightly but she still has some pruritis and discharge. She was told to come be retested for both. ? ? ? ?Relevant past medical, surgical, family, and social history reviewed and updated as indicated.  ?Allergies and medications reviewed and updated. Data reviewed: Chart in Epic. ? ? ?Past Medical History:  ?Diagnosis Date  ? Amenorrhea 07/10/2015  ? Asthma   ? Depression   ? Irregular periods 07/10/2015  ? Obesity   ? Scoliosis   ? Sleep apnea   ? ? ?Past Surgical History:  ?Procedure Laterality Date  ? TONSILLECTOMY    ? TONSILLECTOMY AND ADENOIDECTOMY    ? TONSILLECTOMY AND ADENOIDECTOMY    ? TYMPANOSTOMY TUBE PLACEMENT    ? ? ?Social History  ? ?Socioeconomic History  ? Marital status: Married  ?  Spouse name: Not on file  ? Number of children: Not on file  ? Years of education: Not on file  ? Highest education level: Not on file  ?Occupational History  ? Not on file  ?Tobacco Use  ? Smoking status: Light Smoker  ?  Types: Cigarettes  ? Smokeless tobacco: Never  ? Tobacco comments:  ?  Patient is vaping off and on.  Not daily  ?Vaping Use  ? Vaping Use: Never used  ?Substance and Sexual Activity  ? Alcohol use: No  ? Drug use: No  ? Sexual activity: Yes  ?  Birth control/protection: Condom, Implant  ?Other Topics Concern  ? Not on file  ?Social History Narrative  ? [name] is a 10th grade student.  ? She attends [employer].  ? She lives with both  parents and her cousin.  ? She enjoys basketball, hanging with friends, and reading.  ? ?Social Determinants of Health  ? ?Financial Resource Strain: Not on file  ?Food Insecurity: Food Insecurity Present  ? Worried About Charity fundraiser in the Last Year: Sometimes true  ? Ran Out of Food in the Last Year: Sometimes true  ?Transportation Needs: No Transportation Needs  ? Lack of Transportation (Medical): No  ? Lack of Transportation (Non-Medical): No  ?Physical Activity: Not on file  ?Stress: Not on file  ?Social Connections: Not on file  ?Intimate Partner Violence: Not on file  ? ? ?Outpatient Encounter Medications as of 03/05/2022  ?Medication Sig  ? amoxicillin-clavulanate (AUGMENTIN) 875-125 MG tablet Take 1 tablet by mouth 2 (two) times daily.  ? busPIRone (BUSPAR) 10 MG tablet Take 1 tablet (10 mg total) by mouth 2 (two) times daily.  ? cetirizine (ZYRTEC ALLERGY) 10 MG tablet Take 1 tablet (10 mg total) by mouth daily.  ? fluconazole (DIFLUCAN) 150 MG tablet Take by mouth.  ? medroxyPROGESTERone (PROVERA) 10 MG tablet Take by mouth.  ? metFORMIN (GLUCOPHAGE) 500 MG tablet Take 500 mg by mouth daily with breakfast.  ? ondansetron (ZOFRAN) 4 MG tablet Take 4 mg  by mouth every 8 (eight) hours as needed.  ? phentermine (ADIPEX-P) 37.5 MG tablet Take 37.5 mg by mouth every morning.  ? PROAIR HFA 108 (90 Base) MCG/ACT inhaler Inhale into the lungs.  ? Vitamin D, Ergocalciferol, (DRISDOL) 1.25 MG (50000 UNIT) CAPS capsule Take 50,000 Units by mouth once a week.  ? tiZANidine (ZANAFLEX) 4 MG tablet Take 1 tablet (4 mg total) by mouth every 6 (six) hours as needed for muscle spasms. (Patient not taking: Reported on 03/05/2022)  ? [DISCONTINUED] amoxicillin (AMOXIL) 875 MG tablet Take 1 tablet (875 mg total) by mouth 2 (two) times daily. 1 po BID  ? ?No facility-administered encounter medications on file as of 03/05/2022.  ? ? ?Allergies  ?Allergen Reactions  ? Hydrocodone Palpitations  ? Pineapple Anaphylaxis and  Itching  ? Wound Dressing Adhesive Itching and Rash  ? Adhesive [Tape] Rash  ? Latex Rash  ? ? ?Review of Systems  ?Constitutional:  Negative for activity change, appetite change, chills, fatigue and fever.  ?HENT:  Positive for sore throat.   ?Eyes: Negative.   ?Respiratory:  Negative for cough, chest tightness and shortness of breath.   ?Cardiovascular:  Negative for chest pain, palpitations and leg swelling.  ?Gastrointestinal:  Negative for blood in stool, constipation, diarrhea, nausea and vomiting.  ?Endocrine: Negative.   ?Genitourinary:  Positive for vaginal discharge. Negative for dysuria, frequency and urgency.  ?Musculoskeletal:  Negative for arthralgias and myalgias.  ?Skin: Negative.   ?Allergic/Immunologic: Negative.   ?Neurological:  Negative for dizziness and headaches.  ?Hematological: Negative.   ?Psychiatric/Behavioral:  Negative for confusion, hallucinations, sleep disturbance and suicidal ideas.   ?All other systems reviewed and are negative. ? ?   ? ?Objective:  ?BP 120/67   Pulse 84   Temp (!) 97.5 ?F (36.4 ?C) (Temporal)   Ht '5\' 10"'$  (1.778 m)   Wt (!) 335 lb 4 oz (152.1 kg)   BMI 48.10 kg/m?   ? ?Wt Readings from Last 3 Encounters:  ?03/05/22 (!) 335 lb 4 oz (152.1 kg)  ?02/12/22 (!) 340 lb (154.2 kg)  ?01/01/22 (!) 347 lb (157.4 kg)  ? ? ?Physical Exam ?Vitals and nursing note reviewed.  ?Constitutional:   ?   General: She is not in acute distress. ?   Appearance: Normal appearance. She is morbidly obese. She is not ill-appearing, toxic-appearing or diaphoretic.  ?Eyes:  ?   Pupils: Pupils are equal, round, and reactive to light.  ?Cardiovascular:  ?   Rate and Rhythm: Normal rate and regular rhythm.  ?Pulmonary:  ?   Effort: Pulmonary effort is normal.  ?Skin: ?   General: Skin is warm and dry.  ?   Capillary Refill: Capillary refill takes less than 2 seconds.  ?Neurological:  ?   General: No focal deficit present.  ?   Mental Status: She is alert and oriented to person, place, and  time.  ?Psychiatric:     ?   Mood and Affect: Mood normal.     ?   Behavior: Behavior normal.     ?   Thought Content: Thought content normal.     ?   Judgment: Judgment normal.  ? ? ?Results for orders placed or performed in visit on 02/17/22  ?Iron, TIBC and Ferritin Panel  ?Result Value Ref Range  ? Total Iron Binding Capacity 332 250 - 450 ug/dL  ? UIBC 267 131 - 425 ug/dL  ? Iron 65 27 - 159 ug/dL  ? Iron Saturation 20 15 -  55 %  ? Ferritin 130 15 - 150 ng/mL  ? ?   ? ?Pertinent labs & imaging results that were available during my care of the patient were reviewed by me and considered in my medical decision making. ? ?Assessment & Plan:  ?Talibah was seen today for vaginitis. ? ?Diagnoses and all orders for this visit: ? ?Strep pharyngitis ?Currently on antibiotics for strep. Pt aware she does not need to be tested for strep as she is currently being treated. Pt verbalized understanding and aware to complete full course of antibiotics. Pt aware if symptoms persist post completion of therapy, then follow up. ? ?Candida vaginitis ?Pt aware to take last diflucan pill 3 days after completion of antibiotic therapy. If remains symptomatic after use of diflucan, then be tested.  ?  ? ?Continue all other maintenance medications. ? ?Follow up plan: ?Return if symptoms worsen or fail to improve. ? ? ?Continue healthy lifestyle choices, including diet (rich in fruits, vegetables, and lean proteins, and low in salt and simple carbohydrates) and exercise (at least 30 minutes of moderate physical activity daily). ? ?The above assessment and management plan was discussed with the patient. The patient verbalized understanding of and has agreed to the management plan. Patient is aware to call the clinic if they develop any new symptoms or if symptoms persist or worsen. Patient is aware when to return to the clinic for a follow-up visit. Patient educated on when it is appropriate to go to the emergency department.  ? ?Monia Pouch, FNP-C ?[place] ?[phone] ? ? ?

## 2022-03-05 NOTE — Telephone Encounter (Signed)
? ?  Telephone encounter was:  Successful.  ?03/05/2022 ?Name: [name] MRN: [id number] DOB: 2001/04/18 ? ?Connie Rivera is a 21 y.o. year old female who is a primary care patient of Gwenlyn Perking, FNP . The community resource team was consulted for assistance with Food Insecurity ? ?Care guide performed the following interventions: Patient provided with information about care guide support team and interviewed to confirm resource needs.patient did get resources in the mail. No questions or concerns she has my contact information if needed ? ?Follow Up Plan:  No further follow up planned at this time. The patient has been provided with needed resources. ? ? ? ?[name] ?Care Guide, Embedded Care Coordination ?[place], Care Management  ?[phone] ?[address], [place], [place] [address] ?Phone: [phone] ?Email: [email or web address] ? ?  ?

## 2022-03-09 DIAGNOSIS — M545 Low back pain, unspecified: Secondary | ICD-10-CM | POA: Diagnosis not present

## 2022-03-10 ENCOUNTER — Encounter: Payer: Self-pay | Admitting: Nurse Practitioner

## 2022-03-10 ENCOUNTER — Ambulatory Visit (INDEPENDENT_AMBULATORY_CARE_PROVIDER_SITE_OTHER): Payer: Medicaid Other | Admitting: Nurse Practitioner

## 2022-03-10 ENCOUNTER — Ambulatory Visit (INDEPENDENT_AMBULATORY_CARE_PROVIDER_SITE_OTHER): Payer: Medicaid Other

## 2022-03-10 VITALS — BP 130/73 | HR 82 | Temp 98.5°F | Ht 70.0 in | Wt 336.0 lb

## 2022-03-10 DIAGNOSIS — N926 Irregular menstruation, unspecified: Secondary | ICD-10-CM

## 2022-03-10 DIAGNOSIS — R109 Unspecified abdominal pain: Secondary | ICD-10-CM

## 2022-03-10 DIAGNOSIS — R300 Dysuria: Secondary | ICD-10-CM | POA: Diagnosis not present

## 2022-03-10 LAB — URINALYSIS, ROUTINE W REFLEX MICROSCOPIC
Bilirubin, UA: NEGATIVE
Glucose, UA: NEGATIVE
Ketones, UA: NEGATIVE
Leukocytes,UA: NEGATIVE
Nitrite, UA: NEGATIVE
Protein,UA: NEGATIVE
RBC, UA: NEGATIVE
Specific Gravity, UA: 1.025 (ref 1.005–1.030)
Urobilinogen, Ur: 0.2 mg/dL (ref 0.2–1.0)
pH, UA: 5.5 (ref 5.0–7.5)

## 2022-03-10 LAB — PREGNANCY, URINE: Preg Test, Ur: NEGATIVE

## 2022-03-10 NOTE — Progress Notes (Signed)
? ?Acute Office Visit ? ?Subjective:  ? ? Patient ID: Connie Rivera, female    DOB: November 16, 2001, 21 y.o.   MRN: [id number] ? ?Chief Complaint  ?Patient presents with  ? Dysuria  ? ? ?Dysuria  ?This is a recurrent problem. The current episode started 1 to 4 weeks ago. The problem occurs intermittently. The problem has been unchanged. The quality of the pain is described as aching. The pain is at a severity of 5/10. The pain is moderate. There has been no fever. Associated symptoms include flank pain. Pertinent negatives include no chills, discharge, frequency, nausea or urgency. She has tried nothing for the symptoms. Her past medical history is significant for recurrent UTIs. There is no history of kidney stones.  ? ? ?Past Medical History:  ?Diagnosis Date  ? Amenorrhea 07/10/2015  ? Asthma   ? Depression   ? Irregular periods 07/10/2015  ? Obesity   ? Scoliosis   ? Sleep apnea   ? ? ?Past Surgical History:  ?Procedure Laterality Date  ? TONSILLECTOMY    ? TONSILLECTOMY AND ADENOIDECTOMY    ? TONSILLECTOMY AND ADENOIDECTOMY    ? TYMPANOSTOMY TUBE PLACEMENT    ? ? ?Family History  ?Problem Relation Age of Onset  ? Hyperlipidemia Father   ? Hypertension Father   ? Migraines Mother   ? Febrile seizures Sister   ?     Resolved  ? ADD / ADHD Sister   ? Anxiety disorder Sister   ? Bipolar disorder Sister   ? Migraines Maternal Grandmother   ? Diabetes Maternal Grandfather   ? Kidney disease Maternal Grandfather   ? Hypertension Maternal Grandfather   ? Hyperlipidemia Maternal Grandfather   ? Heart murmur Maternal Grandfather   ? Rheumatic fever Maternal Grandfather   ? Cancer Paternal Grandmother   ?     breast  ? COPD Paternal Grandmother   ? Emphysema Paternal Grandmother   ? Congestive Heart Failure Paternal Grandmother   ? Cancer Paternal Grandfather   ? Heart attack Neg Hx   ? Sudden death Neg Hx   ? ? ?Social History  ? ?Socioeconomic History  ? Marital status: Married  ?  Spouse name: Not on file  ? Number of  children: Not on file  ? Years of education: Not on file  ? Highest education level: Not on file  ?Occupational History  ? Not on file  ?Tobacco Use  ? Smoking status: Light Smoker  ?  Types: Cigarettes  ? Smokeless tobacco: Never  ? Tobacco comments:  ?  Patient is vaping off and on.  Not daily  ?Vaping Use  ? Vaping Use: Never used  ?Substance and Sexual Activity  ? Alcohol use: No  ? Drug use: No  ? Sexual activity: Yes  ?  Birth control/protection: Condom, Implant  ?Other Topics Concern  ? Not on file  ?Social History Narrative  ? Connie Rivera is a 10th grade student.  ? She attends [employer].  ? She lives with both parents and her cousin.  ? She enjoys basketball, hanging with friends, and reading.  ? ?Social Determinants of Health  ? ?Financial Resource Strain: Not on file  ?Food Insecurity: Food Insecurity Present  ? Worried About Charity fundraiser in the Last Year: Sometimes true  ? Ran Out of Food in the Last Year: Sometimes true  ?Transportation Needs: No Transportation Needs  ? Lack of Transportation (Medical): No  ? Lack of Transportation (Non-Medical): No  ?  Physical Activity: Not on file  ?Stress: Not on file  ?Social Connections: Not on file  ?Intimate Partner Violence: Not on file  ? ? ?Outpatient Medications Prior to Visit  ?Medication Sig Dispense Refill  ? busPIRone (BUSPAR) 10 MG tablet Take 1 tablet (10 mg total) by mouth 2 (two) times daily. 60 tablet 1  ? cetirizine (ZYRTEC ALLERGY) 10 MG tablet Take 1 tablet (10 mg total) by mouth daily. 30 tablet 1  ? medroxyPROGESTERone (PROVERA) 10 MG tablet Take by mouth.    ? metFORMIN (GLUCOPHAGE) 500 MG tablet Take 500 mg by mouth daily with breakfast.    ? ondansetron (ZOFRAN) 4 MG tablet Take 4 mg by mouth every 8 (eight) hours as needed.    ? phentermine (ADIPEX-P) 37.5 MG tablet Take 37.5 mg by mouth every morning.    ? PROAIR HFA 108 (90 Base) MCG/ACT inhaler Inhale into the lungs.    ? tiZANidine (ZANAFLEX) 4 MG tablet Take 1 tablet (4 mg  total) by mouth every 6 (six) hours as needed for muscle spasms. 30 tablet 0  ? Vitamin D, Ergocalciferol, (DRISDOL) 1.25 MG (50000 UNIT) CAPS capsule Take 50,000 Units by mouth once a week.    ? amoxicillin-clavulanate (AUGMENTIN) 875-125 MG tablet Take 1 tablet by mouth 2 (two) times daily.    ? fluconazole (DIFLUCAN) 150 MG tablet Take by mouth.    ? ?No facility-administered medications prior to visit.  ? ? ?Allergies  ?Allergen Reactions  ? Hydrocodone Palpitations  ? Pineapple Anaphylaxis and Itching  ? Wound Dressing Adhesive Itching and Rash  ? Adhesive [Tape] Rash  ? Latex Rash  ? ? ?Review of Systems  ?Constitutional:  Negative for chills.  ?HENT: Negative.    ?Eyes: Negative.   ?Respiratory: Negative.    ?Gastrointestinal:  Negative for nausea.  ?Genitourinary:  Positive for dysuria and flank pain. Negative for frequency and urgency.  ?All other systems reviewed and are negative. ? ?   ?Objective:  ?  ?Physical Exam ?Vitals and nursing note reviewed.  ?Constitutional:   ?   Appearance: Normal appearance. She is obese.  ?HENT:  ?   Right Ear: External ear normal.  ?   Left Ear: External ear normal.  ?   Nose: Nose normal.  ?Eyes:  ?   Conjunctiva/sclera: Conjunctivae normal.  ?Cardiovascular:  ?   Rate and Rhythm: Normal rate and regular rhythm.  ?   Pulses: Normal pulses.  ?   Heart sounds: Normal heart sounds.  ?Pulmonary:  ?   Effort: Pulmonary effort is normal.  ?   Breath sounds: Normal breath sounds.  ?Abdominal:  ?   General: Bowel sounds are normal.  ?   Tenderness: There is right CVA tenderness and left CVA tenderness.  ?Skin: ?   General: Skin is warm.  ?   Findings: No rash.  ?Neurological:  ?   General: No focal deficit present.  ?   Mental Status: She is alert and oriented to person, place, and time.  ?Psychiatric:     ?   Behavior: Behavior normal.  ? ? ?BP 130/73   Pulse 82   Temp 98.5 ?F (36.9 ?C)   Ht _0  (1.778 m)   Wt (!) 336 lb (152.4 kg)   LMP 01/14/2022 (Approximate)   SpO2  100%   BMI 48.21 kg/m?  ?Wt Readings from Last 3 Encounters:  ?03/10/22 (!) 336 lb (152.4 kg)  ?03/05/22 (!) 335 lb 4 oz (152.1 kg)  ?02/12/22 Marland Kitchen)  340 lb (154.2 kg)  ? ? ?Health Maintenance Due  ?Topic Date Due  ? CHLAMYDIA SCREENING  07/25/2021  ? ? ?There are no preventive care reminders to display for this patient. ? ? ?Lab Results  ?Component Value Date  ? TSH 0.951 12/17/2021  ? ?Lab Results  ?Component Value Date  ? WBC 8.2 02/12/2022  ? HGB 16.0 (H) 02/12/2022  ? HCT 47.3 (H) 02/12/2022  ? MCV 88 02/12/2022  ? PLT 256 02/12/2022  ? ?Lab Results  ?Component Value Date  ? NA 145 (H) 02/12/2022  ? K 3.8 02/12/2022  ? CO2 29 02/12/2022  ? GLUCOSE 88 02/12/2022  ? BUN 13 02/12/2022  ? CREATININE 0.78 02/12/2022  ? BILITOT 0.5 02/12/2022  ? ALKPHOS 66 02/12/2022  ? AST 95 (H) 02/12/2022  ? ALT 56 (H) 02/12/2022  ? PROT 6.7 02/12/2022  ? ALBUMIN 4.5 02/12/2022  ? CALCIUM 9.7 02/12/2022  ? ANIONGAP 6 08/01/2015  ? EGFR 111 02/12/2022  ? ?Lab Results  ?Component Value Date  ? CHOL 143 12/17/2021  ? ?Lab Results  ?Component Value Date  ? HDL 35 (L) 12/17/2021  ? ?Lab Results  ?Component Value Date  ? [place] 85 12/17/2021  ? ?Lab Results  ?Component Value Date  ? TRIG 127 12/17/2021  ? ?Lab Results  ?Component Value Date  ? CHOLHDL 4.1 12/17/2021  ? ?Lab Results  ?Component Value Date  ? HGBA1C 5.1 04/21/2017  ? ? ?   ?Assessment & Plan:  ? ?UTI  vs  interstitial cystitis ?-urinalysis completed results pending ?-pyridium for pain ?- patient just completed 2 weeks of antibiotics, reassessing symptoms  ?-KUB completed results pending ?Precaution and education provided ?-All questions answered ?-follow up with unresolved symptoms  ?Problem List Items Addressed This Visit   ?None ?Visit Diagnoses   ? ? Dysuria    -  Primary  ? Relevant Orders  ? Urinalysis, Routine w reflex microscopic  ? Urine Culture  ? Flank pain      ? Relevant Orders  ? DG Abd 1 View  ? Missed periods      ? Relevant Orders  ? Pregnancy, urine  ? ?   ? ? ? ?No orders of the defined types were placed in this encounter. ? ? ? ?Ivy Lynn, NP ? ?

## 2022-03-10 NOTE — Patient Instructions (Signed)
Dysuria ?Dysuria is pain or discomfort during urination. The pain or discomfort may be felt in the part of the body that drains urine from the bladder (urethra) or in the surrounding tissue of the genitals. The pain may also be felt in the groin area, lower abdomen, or lower back. ?You may have to urinate frequently or have the sudden feeling that you have to urinate (urgency). Dysuria can affect anyone, but it is more common in females. Dysuria can be caused by many different things, including: ?Urinary tract infection. ?Kidney stones or bladder stones. ?Certain STIs (sexually transmitted infections), such as chlamydia. ?Dehydration. ?Inflammation of the tissues of the vagina. ?Use of certain medicines. ?Use of certain soaps or scented products that cause irritation. ?Follow these instructions at home: ?Medicines ?Take over-the-counter and prescription medicines only as told by your health care provider. ?If you were prescribed an antibiotic medicine, take it as told by your health care provider. Do not stop taking the antibiotic even if you start to feel better. ?Eating and drinking ? ?Drink enough fluid to keep your urine pale yellow. ?Avoid caffeinated beverages, tea, and alcohol. These beverages can irritate the bladder and make dysuria worse. In males, alcohol may irritate the prostate. ?General instructions ?Watch your condition for any changes. ?Urinate often. Avoid holding urine for long periods of time. ?If you are female, you should wipe from front to back after urinating or having a bowel movement. Use each piece of toilet paper only once. ?Empty your bladder after sex. ?Keep all follow-up visits. This is important. ?If you had any tests done to find the cause of dysuria, it is up to you to get your test results. Ask your health care provider, or the department that is doing the test, when your results will be ready. ?Contact a health care provider if: ?You have a fever. ?You develop pain in your back or  sides. ?You have nausea or vomiting. ?You have blood in your urine. ?You are not urinating as often as you usually do. ?Get help right away if: ?Your pain is severe and not relieved with medicines. ?You cannot eat or drink without vomiting. ?You are confused. ?You have a rapid heartbeat while resting. ?You have shaking or chills. ?You feel extremely weak. ?Summary ?Dysuria is pain or discomfort while urinating. Many different conditions can lead to dysuria. ?If you have dysuria, you may have to urinate frequently or have the sudden feeling that you have to urinate (urgency). ?Watch your condition for any changes. Keep all follow-up visits. ?Make sure that you urinate often and drink enough fluid to keep your urine pale yellow. ?This information is not intended to replace advice given to you by your health care provider. Make sure you discuss any questions you have with your health care provider. ?Document Revised: 07/19/2020 Document Reviewed: 07/19/2020 ?Elsevier Patient Education ? 2022 Elsevier Inc. ? ?

## 2022-03-12 DIAGNOSIS — R39198 Other difficulties with micturition: Secondary | ICD-10-CM | POA: Diagnosis not present

## 2022-03-14 LAB — URINE CULTURE

## 2022-03-16 ENCOUNTER — Other Ambulatory Visit: Payer: Self-pay | Admitting: Nurse Practitioner

## 2022-03-16 DIAGNOSIS — N3001 Acute cystitis with hematuria: Secondary | ICD-10-CM | POA: Diagnosis not present

## 2022-03-16 MED ORDER — CIPROFLOXACIN HCL 500 MG PO TABS: 500.0000 mg | ORAL_TABLET | Freq: Two times a day (BID) | ORAL | 0 refills | Status: DC

## 2022-03-16 NOTE — Progress Notes (Signed)
Pt has gone to [facility] to get an antibiotic since no one called her about an antibiotic being prescribed  ?

## 2022-03-16 NOTE — Progress Notes (Signed)
Patient already had the correct medication please follow-up with unresolved symptoms. ?

## 2022-03-16 NOTE — Progress Notes (Signed)
Pt went to [facility] since she was not prescribed antibiotic here for her UTI, no longer needs antibiotic to be prescribed  ?

## 2022-03-16 NOTE — Progress Notes (Signed)
I sent Cipro to pharmacy, patient can not take Zanaflex with CIPRO  she has to hold  Zanaflex until she completes CIPRO antibiotics ?

## 2022-03-18 ENCOUNTER — Encounter: Payer: Self-pay | Admitting: Family Medicine

## 2022-03-18 ENCOUNTER — Ambulatory Visit (INDEPENDENT_AMBULATORY_CARE_PROVIDER_SITE_OTHER): Payer: Medicaid Other | Admitting: Family Medicine

## 2022-03-18 VITALS — BP 118/76 | HR 89 | Temp 97.2°F | Ht 70.0 in | Wt 335.4 lb

## 2022-03-18 DIAGNOSIS — F411 Generalized anxiety disorder: Secondary | ICD-10-CM

## 2022-03-18 DIAGNOSIS — F3342 Major depressive disorder, recurrent, in full remission: Secondary | ICD-10-CM | POA: Diagnosis not present

## 2022-03-18 NOTE — Patient Instructions (Signed)

## 2022-03-18 NOTE — Progress Notes (Signed)
? ?Acute Office Visit ? ?Subjective:  ? ? Patient ID: Connie Rivera, female    DOB: September 07, 2001, 21 y.o.   MRN: [id number] ? ?Chief Complaint  ?Patient presents with  ? Anxiety  ? Medical Management of Chronic Issues  ? ? ?HPI ?Patient is in today for anxiety. She has been on buspar for 3 months. She reports doing well on buspar and does not desire a change with medications today. She feels like this controls her anxiety better than any other medication she has tried so far. She also denies symptoms of depression.  ? ? ? ?  03/18/2022  ?  8:42 AM 03/10/2022  ?  8:16 AM 02/20/2022  ? 11:11 AM  ?Depression screen PHQ 2/9  ?Decreased Interest 0 0 0  ?Down, Depressed, Hopeless 0 1 1  ?PHQ - 2 Score 0 1 1  ?Altered sleeping 0 1 1  ?Tired, decreased energy 0 0 1  ?Change in appetite 0 0 0  ?Feeling bad or failure about yourself  0 0 0  ?Trouble concentrating 0 0 0  ?Moving slowly or fidgety/restless 0 0 0  ?Suicidal thoughts 0 0 0  ?PHQ-9 Score 0 2 3  ?Difficult doing work/chores Not difficult at all Somewhat difficult   ? ? ?  03/18/2022  ?  8:43 AM 03/10/2022  ?  8:17 AM 02/20/2022  ? 11:09 AM 02/12/2022  ?  2:56 PM  ?GAD 7 : Generalized Anxiety Score  ?Nervous, Anxious, on Edge 1 1 1 3   ?Control/stop worrying 0 1 1 3   ?Worry too much - different things 1 1 1 3   ?Trouble relaxing 0 1 1 3   ?Restless 0 0 0 2  ?Easily annoyed or irritable 1 1 1 2   ?Afraid - awful might happen 1 1 1 3   ?Total GAD 7 Score 4 6 6 19   ?Anxiety Difficulty Somewhat difficult Somewhat difficult    ? ? ? ? ?Past Medical History:  ?Diagnosis Date  ? Amenorrhea 07/10/2015  ? Asthma   ? Depression   ? Irregular periods 07/10/2015  ? Obesity   ? Scoliosis   ? Sleep apnea   ? ? ?Past Surgical History:  ?Procedure Laterality Date  ? TONSILLECTOMY    ? TONSILLECTOMY AND ADENOIDECTOMY    ? TONSILLECTOMY AND ADENOIDECTOMY    ? TYMPANOSTOMY TUBE PLACEMENT    ? ? ?Family History  ?Problem Relation Age of Onset  ? Hyperlipidemia Father   ? Hypertension Father   ? Migraines  Mother   ? Febrile seizures Sister   ?     Resolved  ? ADD / ADHD Sister   ? Anxiety disorder Sister   ? Bipolar disorder Sister   ? Migraines Maternal Grandmother   ? Diabetes Maternal Grandfather   ? Kidney disease Maternal Grandfather   ? Hypertension Maternal Grandfather   ? Hyperlipidemia Maternal Grandfather   ? Heart murmur Maternal Grandfather   ? Rheumatic fever Maternal Grandfather   ? Cancer Paternal Grandmother   ?     breast  ? COPD Paternal Grandmother   ? Emphysema Paternal Grandmother   ? Congestive Heart Failure Paternal Grandmother   ? Cancer Paternal Grandfather   ? Heart attack Neg Hx   ? Sudden death Neg Hx   ? ? ?Social History  ? ?Socioeconomic History  ? Marital status: Married  ?  Spouse name: Not on file  ? Number of children: Not on file  ? Years of education: Not on file  ?  Highest education level: Not on file  ?Occupational History  ? Not on file  ?Tobacco Use  ? Smoking status: Light Smoker  ?  Types: Cigarettes  ? Smokeless tobacco: Never  ? Tobacco comments:  ?  Patient is vaping off and on.  Not daily  ?Vaping Use  ? Vaping Use: Never used  ?Substance and Sexual Activity  ? Alcohol use: No  ? Drug use: No  ? Sexual activity: Yes  ?  Birth control/protection: Condom, Implant  ?Other Topics Concern  ? Not on file  ?Social History Narrative  ? Evangelyne is a 10th grade student.  ? She attends [employer].  ? She lives with both parents and her cousin.  ? She enjoys basketball, hanging with friends, and reading.  ? ?Social Determinants of Health  ? ?Financial Resource Strain: Not on file  ?Food Insecurity: Food Insecurity Present  ? Worried About Charity fundraiser in the Last Year: Sometimes true  ? Ran Out of Food in the Last Year: Sometimes true  ?Transportation Needs: No Transportation Needs  ? Lack of Transportation (Medical): No  ? Lack of Transportation (Non-Medical): No  ?Physical Activity: Not on file  ?Stress: Not on file  ?Social Connections: Not on file  ?Intimate  Partner Violence: Not on file  ? ? ?Outpatient Medications Prior to Visit  ?Medication Sig Dispense Refill  ? busPIRone (BUSPAR) 10 MG tablet Take 1 tablet (10 mg total) by mouth 2 (two) times daily. 60 tablet 1  ? cetirizine (ZYRTEC ALLERGY) 10 MG tablet Take 1 tablet (10 mg total) by mouth daily. 30 tablet 1  ? medroxyPROGESTERone (PROVERA) 10 MG tablet Take by mouth.    ? metFORMIN (GLUCOPHAGE) 500 MG tablet Take 500 mg by mouth daily with breakfast.    ? ondansetron (ZOFRAN) 4 MG tablet Take 4 mg by mouth every 8 (eight) hours as needed.    ? phentermine (ADIPEX-P) 37.5 MG tablet Take 37.5 mg by mouth every morning.    ? PROAIR HFA 108 (90 Base) MCG/ACT inhaler Inhale into the lungs.    ? Vitamin D, Ergocalciferol, (DRISDOL) 1.25 MG (50000 UNIT) CAPS capsule Take 50,000 Units by mouth once a week.    ? cephALEXin (KEFLEX) 500 MG capsule Take 1,000 mg by mouth 2 (two) times daily.    ? ciprofloxacin (CIPRO) 500 MG tablet Take 1 tablet (500 mg total) by mouth 2 (two) times daily. (Patient not taking: Reported on 03/18/2022) 10 tablet 0  ? ?No facility-administered medications prior to visit.  ? ? ?Allergies  ?Allergen Reactions  ? Hydrocodone Palpitations  ? Pineapple Anaphylaxis and Itching  ? Wound Dressing Adhesive Itching and Rash  ? Adhesive [Tape] Rash  ? Latex Rash  ? ? ?Review of Systems ?As per HPI.  ?   ?Objective:  ?  ?Physical Exam ?Vitals and nursing note reviewed.  ?Constitutional:   ?   General: She is not in acute distress. ?   Appearance: She is not ill-appearing, toxic-appearing or diaphoretic.  ?Cardiovascular:  ?   Rate and Rhythm: Normal rate and regular rhythm.  ?   Heart sounds: Normal heart sounds. No murmur heard. ?Pulmonary:  ?   Effort: Pulmonary effort is normal. No respiratory distress.  ?   Breath sounds: Normal breath sounds.  ?Skin: ?   General: Skin is warm and dry.  ?Neurological:  ?   General: No focal deficit present.  ?   Mental Status: She is alert and oriented to  person,  place, and time.  ?Psychiatric:     ?   Mood and Affect: Mood normal.     ?   Behavior: Behavior normal.     ?   Thought Content: Thought content normal.     ?   Judgment: Judgment normal.  ? ? ?BP 118/76   Pulse 89   Temp (!) 97.2 ?F (36.2 ?C) (Temporal)   Ht 5' 10"  (1.778 m)   Wt (!) 335 lb 6 oz (152.1 kg)   BMI 48.12 kg/m?  ?Wt Readings from Last 3 Encounters:  ?03/18/22 (!) 335 lb 6 oz (152.1 kg)  ?03/10/22 (!) 336 lb (152.4 kg)  ?03/05/22 (!) 335 lb 4 oz (152.1 kg)  ? ? ?Health Maintenance Due  ?Topic Date Due  ? CHLAMYDIA SCREENING  07/25/2021  ? ? ?There are no preventive care reminders to display for this patient. ? ? ?Lab Results  ?Component Value Date  ? TSH 0.951 12/17/2021  ? ?Lab Results  ?Component Value Date  ? WBC 8.2 02/12/2022  ? HGB 16.0 (H) 02/12/2022  ? HCT 47.3 (H) 02/12/2022  ? MCV 88 02/12/2022  ? PLT 256 02/12/2022  ? ?Lab Results  ?Component Value Date  ? NA 145 (H) 02/12/2022  ? K 3.8 02/12/2022  ? CO2 29 02/12/2022  ? GLUCOSE 88 02/12/2022  ? BUN 13 02/12/2022  ? CREATININE 0.78 02/12/2022  ? BILITOT 0.5 02/12/2022  ? ALKPHOS 66 02/12/2022  ? AST 95 (H) 02/12/2022  ? ALT 56 (H) 02/12/2022  ? PROT 6.7 02/12/2022  ? ALBUMIN 4.5 02/12/2022  ? CALCIUM 9.7 02/12/2022  ? ANIONGAP 6 08/01/2015  ? EGFR 111 02/12/2022  ? ?Lab Results  ?Component Value Date  ? CHOL 143 12/17/2021  ? ?Lab Results  ?Component Value Date  ? HDL 35 (L) 12/17/2021  ? ?Lab Results  ?Component Value Date  ? [place] 85 12/17/2021  ? ?Lab Results  ?Component Value Date  ? TRIG 127 12/17/2021  ? ?Lab Results  ?Component Value Date  ? CHOLHDL 4.1 12/17/2021  ? ?Lab Results  ?Component Value Date  ? HGBA1C 5.1 04/21/2017  ? ? ?   ?Assessment & Plan:  ? ? ?Eimi was seen today for anxiety and medical management of chronic issues. ? ?Diagnoses and all orders for this visit: ? ?GAD (generalized anxiety disorder) ?Recurrent major depressive disorder, in full remission ([address]) ?Well controlled on current regimen. Continue  buspar ? ?Return in about 6 months (around 09/18/2022) for chronic follow up. ? ?The patient indicates understanding of these issues and agrees with the plan. ? ?Gwenlyn Perking, FNP ? ?

## 2022-03-19 ENCOUNTER — Other Ambulatory Visit: Payer: Self-pay

## 2022-03-22 DIAGNOSIS — Z91040 Latex allergy status: Secondary | ICD-10-CM | POA: Diagnosis not present

## 2022-03-22 DIAGNOSIS — Z91048 Other nonmedicinal substance allergy status: Secondary | ICD-10-CM | POA: Diagnosis not present

## 2022-03-22 DIAGNOSIS — J45909 Unspecified asthma, uncomplicated: Secondary | ICD-10-CM | POA: Diagnosis not present

## 2022-03-22 DIAGNOSIS — M419 Scoliosis, unspecified: Secondary | ICD-10-CM | POA: Diagnosis not present

## 2022-03-22 DIAGNOSIS — F17210 Nicotine dependence, cigarettes, uncomplicated: Secondary | ICD-10-CM | POA: Diagnosis not present

## 2022-03-22 DIAGNOSIS — E860 Dehydration: Secondary | ICD-10-CM | POA: Diagnosis not present

## 2022-03-22 DIAGNOSIS — Z91018 Allergy to other foods: Secondary | ICD-10-CM | POA: Diagnosis not present

## 2022-03-22 DIAGNOSIS — Z79899 Other long term (current) drug therapy: Secondary | ICD-10-CM | POA: Diagnosis not present

## 2022-03-22 DIAGNOSIS — M545 Low back pain, unspecified: Secondary | ICD-10-CM | POA: Diagnosis not present

## 2022-03-24 ENCOUNTER — Telehealth: Payer: Self-pay

## 2022-03-24 ENCOUNTER — Other Ambulatory Visit: Payer: Self-pay | Admitting: Family Medicine

## 2022-03-24 DIAGNOSIS — F411 Generalized anxiety disorder: Secondary | ICD-10-CM

## 2022-03-24 NOTE — Telephone Encounter (Signed)
Transition Care Management Follow-up Telephone Call ?Date of discharge and from where: 03/23/2022-[facility]  ?How have you been since you were released from the hospital? Pt is doing fine.  ?Any questions or concerns? No ? ?Items Reviewed: ?Did the pt receive and understand the discharge instructions provided? Yes  ?Medications obtained and verified? Yes  ?Other? No  ?Any new allergies since your discharge? No  ?Dietary orders reviewed? No ?Do you have support at home? Yes  ? ?Home Care and Equipment/Supplies: ?Were home health services ordered? not applicable ?If so, what is the name of the agency? N/A  ?Has the agency set up a time to come to the patient's home? not applicable ?Were any new equipment or medical supplies ordered?  No ?What is the name of the medical supply agency? N/A ?Were you able to get the supplies/equipment? not applicable ?Do you have any questions related to the use of the equipment or supplies? No ? ?Functional Questionnaire: (I = Independent and D = Dependent) ?ADLs: I ? ?Bathing/Dressing- I ? ?Meal Prep- I ? ?Eating- I ? ?Maintaining continence- I ? ?Transferring/Ambulation- I ? ?Managing Meds- I ? ?Follow up appointments reviewed: ? ?PCP Hospital f/u appt confirmed? No   ?Specialist Hospital f/u appt confirmed? No   ?Are transportation arrangements needed? No  ?If their condition worsens, is the pt aware to call PCP or go to the Emergency Dept.? Yes ?Was the patient provided with contact information for the PCP's office or ED? Yes ?Was to pt encouraged to call back with questions or concerns? Yes  ?

## 2022-03-25 ENCOUNTER — Other Ambulatory Visit: Payer: Self-pay | Admitting: *Deleted

## 2022-03-25 NOTE — Patient Outreach (Signed)
Care Coordination ? ?03/25/2022 ? ?SALA TAGUE ?Aug 29, 2001 ?[id number] ? ? ?Medicaid Managed Care  ? ?Unsuccessful Outreach Note ? ?03/25/2022 ?Name: Connie Rivera MRN: [id number] DOB: 15-Nov-2001 ? ?Referred by: Gwenlyn Perking, FNP ?Reason for referral : High Risk Managed Medicaid (Unsuccessful RNCM follow up telephone outreach) ? ? ?An unsuccessful telephone outreach was attempted today. The patient was referred to the case management team for assistance with care management and care coordination.  ? ?Follow Up Plan: A HIPAA compliant phone message was left for the patient providing contact information and requesting a return call.  ? ?Lurena Joiner RN, BSN ?[place] ?RN Care Coordinator ? ? ?

## 2022-03-25 NOTE — Patient Instructions (Signed)
Visit Information  Ms. Connie Rivera  - as a part of your Medicaid benefit, you are eligible for care management and care coordination services at no cost or copay. I was unable to reach you by phone today but would be happy to help you with your health related needs. Please feel free to call me @ [phone].   A member of the Managed Medicaid care management team will reach out to you again over the next 14 days.   Zohan Shiflet RN, BSN [place]  [facility] RN Care Coordinator   

## 2022-03-30 ENCOUNTER — Other Ambulatory Visit: Payer: Self-pay | Admitting: *Deleted

## 2022-03-30 NOTE — Patient Instructions (Signed)
Visit Information  Ms. Dayanira M Salmon  - as a part of your Medicaid benefit, you are eligible for care management and care coordination services at no cost or copay. I was unable to reach you by phone today but would be happy to help you with your health related needs. Please feel free to call me @ [phone].   A member of the Managed Medicaid care management team will reach out to you again over the next 14 days.   Kimm Sider RN, BSN [place]  [facility] RN Care Coordinator   

## 2022-03-30 NOTE — Patient Outreach (Signed)
Care Coordination ? ?03/30/2022 ? ?[name] ?2001/04/29 ?[id number] ? ? ?Medicaid Managed Care  ? ?Unsuccessful Outreach Note ? ?03/30/2022 ?Name: Connie Rivera MRN: [id number] DOB: September 06, 2001 ? ?Referred by: Gwenlyn Perking, FNP ?Reason for referral : High Risk Managed Medicaid (Unsuccessful RNCM follow up telephone outreach) ? ? ?A second unsuccessful telephone outreach was attempted today. The patient was referred to the case management team for assistance with care management and care coordination.  ? ?Follow Up Plan: A HIPAA compliant phone message was left for the patient providing contact information and requesting a return call.  ? ?Lurena Joiner RN, BSN ?[place] ?RN Care Coordinator ? ? ?

## 2022-03-30 NOTE — Patient Instructions (Signed)
Visit Information ? ?Connie Rivera was given information about Medicaid Managed Care team care coordination services as a part of their Healthy [facility] Medicaid benefit. Connie Rivera verbally consented to engagement with the [facility] Managed Care team.  ? ?If you are experiencing a medical emergency, please call 911 or report to your local emergency department or urgent care.  ? ?If you have a non-emergency medical problem during routine business hours, please contact your provider's office and ask to speak with a nurse.  ? ?For questions related to your Healthy [facility] health plan, please call: [phone] or visit the homepage here: [email or web address] ? ?If you would like to schedule transportation through your Healthy [facility] plan, please call the following number at least 2 days in advance of your appointment: [phone] ? For information about your ride after you set it up, call Ride Assist at [phone]. Use this number to activate a Will Call pickup, or if your transportation is late for a scheduled pickup. Use this number, too, if you need to make a change or cancel a previously scheduled reservation. ? If you need transportation services right away, call [phone]. The after-hours call center is staffed 24 hours to handle ride assistance and urgent reservation requests (including discharges) 365 days a year. Urgent trips include sick visits, hospital discharge requests and life-sustaining treatment. ? ?Call [facility] [place] at [phone], at any time, 24 hours a day, 7 days a week. If you are in danger or need immediate medical attention call 911. ? ?If you would like help to quit smoking, call 1-800-QUIT-NOW 313-622-5748) OR Espa?ol: 1-855-D?jelo-Ya 414-113-6807) o para m?s informaci?n haga clic aqu? or Text READY to 200-400 to register via text ? ?Connie Rivera, ? ? ?Please see education materials related to fatty liver provided by  MyChart link. ? ?Patient verbalizes understanding of instructions and care plan provided today and agrees to view in [place]. Active MyChart status confirmed with patient.   ? ?Telephone follow up appointment with Managed Medicaid care management team member scheduled for:04/29/22 @ 8:30am ? ?Lurena Joiner RN, BSN ?[place] ?RN Care Coordinator ? ? ?Following is a copy of your plan of care:  ?Care Plan : [place] of Care  ?Updates made by Melissa Montane, RN since 03/30/2022 12:00 AM  ?  ? ?Problem: Health Management needs related to Fatty Liver Disease   ?  ? ?Long-Range Goal: Development of Plan o Care to address Health Management needs related to Fatty Liver Disease   ?Start Date: 02/16/2022  ?Expected End Date: 04/29/2022  ?Priority: High  ?Note:   ?Current Barriers:  ?Knowledge Deficits related to plan of care for management of Fatty Liver Diseas Connie Rivera is working on improving her health with diet, taking prescribed medications and weight loss. She recently started a new job and will be working remotely M-F 9-5:30.  ? ?RNCM Clinical Goal(s):  ?Patient will verbalize understanding of plan for management of Fatty Liver Disease as evidenced by attending GI appointment and verbalization of self monitoring activities ?attend all scheduled medical appointments: 05/29/22 with GI and 06/26/22 with PCP as evidenced by provider documentation in EMR        ?continue to work with Consulting civil engineer and/or Social Worker to address care management and care coordination needs related to Fatty Liver Disease as evidenced by adherence to CM Team Scheduled appointments     ?work with pharmacist to address medication management related to Fatty Liver  Disease as evidenced by review of EMR and patient or pharmacist report    ?work with community resource care guide to address needs related to Limited access to food as evidenced by patient and/or community resource care guide support    through  collaboration with Consulting civil engineer, provider, and care team.  ? ?Interventions: ?Inter-disciplinary care team collaboration (see longitudinal plan of care) ?Evaluation of current treatment plan related to  self management and patient's adherence to plan as established by provider ? ? ?Fatty Liver Disease  (Status: Goal on Track (progressing): YES.) Long Term Goal  ?Evaluation of current treatment plan related to  Fatty Liver Disease , Financial constraints related to food insecurity and Mental Health Concerns  self-management and patient's adherence to plan as established by provider. ?Discussed plans with patient for ongoing care management follow up and provided patient with direct contact information for care management team ?Provided education to patient re: mediterranean diet; ?Reviewed medications with patient and discussed taking as directed; ?Reviewed scheduled/upcoming provider appointments including GI 05/29/22 and 06/26/22 with PCP; ?Discussed plans with patient for ongoing care management follow up and provided patient with direct contact information for care management team; ?Discussed the importance of drinking water, encouraged at least 64 ounces per day ?Encouraged patient to increase exercise to 150 minutes per week ? ?Patient Goals/Self-Care Activities: ?Take medications as prescribed   ?Attend all scheduled provider appointments ?Call provider office for new concerns or questions  ?Work on [employer] to include more fruits and vegetables, limiting sugar and fried foods ? ? ?  ? ?  ?

## 2022-03-30 NOTE — Patient Outreach (Signed)
?Medicaid Managed Care   ?Nurse Care Manager Note ? ?03/30/2022 ?Name:  Connie Rivera MRN:  [id number] DOB:  03/11/2001 ? ?Connie Rivera is an 21 y.o. year old female who is a primary patient of Gwenlyn Perking, FNP.  The [facility] Managed Care Coordination team was consulted for assistance with:    ?Fatty Liver Disease ? ?Connie Rivera was given information about Medicaid Managed Care Coordination team services today. Connie Rivera Patient agreed to services and verbal consent obtained. ? ?Engaged with patient by telephone for follow up visit in response to provider referral for case management and/or care coordination services.  ? ?Assessments/Interventions:  Review of past medical history, allergies, medications, health status, including review of consultants reports, laboratory and other test data, was performed as part of comprehensive evaluation and provision of chronic care management services. ? ?SDOH (Social Determinants of Health) assessments and interventions performed: ? ? ?Care Plan ? ?Allergies  ?Allergen Reactions  ? Hydrocodone Palpitations  ? Pineapple Anaphylaxis and Itching  ? Wound Dressing Adhesive Itching and Rash  ? Adhesive [Tape] Rash  ? Latex Rash  ? ? ?Medications Reviewed Today   ? ? Reviewed by Melissa Montane, RN (Registered Nurse) on 03/30/22 at 1341  Med List Status: <None>  ? ?Medication Order Taking? Sig Documenting Provider Last Dose Status Informant  ?busPIRone (BUSPAR) 10 MG tablet [id number] Yes TAKE ONE TABLET BY MOUTH TWICE DAILY Gwenlyn Perking, FNP Taking Active   ?cephALEXin (KEFLEX) 500 MG capsule [id number] No Take 1,000 mg by mouth 2 (two) times daily.  ?Patient not taking: Reported on 03/30/2022  ? [provider] Not Taking Active   ?cetirizine (ZYRTEC ALLERGY) 10 MG tablet [id number] Yes Take 1 tablet (10 mg total) by mouth daily. Ivy Lynn, NP Taking Active   ?ciprofloxacin (CIPRO) 500 MG tablet [id number] No Take 1 tablet (500 mg total) by mouth 2 (two)  times daily.  ?Patient not taking: Reported on 03/18/2022  ? Ivy Lynn, NP Not Taking Active   ?medroxyPROGESTERone (PROVERA) 10 MG tablet [id number] Yes Take by mouth. [provider] Taking Active   ?metFORMIN (GLUCOPHAGE) 500 MG tablet [id number] Yes Take 500 mg by mouth daily with breakfast. [provider] Taking Active   ?ondansetron (ZOFRAN) 4 MG tablet [id number] No Take 4 mg by mouth every 8 (eight) hours as needed.  ?Patient not taking: Reported on 03/30/2022  ? [provider] Not Taking Active   ?         ?Med Note Haywood Filler Jan 01, 2022 12:14 PM) Prn ?  ?phentermine (ADIPEX-P) 37.5 MG tablet [id number] Yes Take 37.5 mg by mouth every morning. [provider] Taking Active   ?PROAIR HFA 108 (90 Base) MCG/ACT inhaler [id number] Yes Inhale into the lungs. [provider] Taking Active   ?Vitamin D, Ergocalciferol, (DRISDOL) 1.25 MG (50000 UNIT) CAPS capsule [id number] Yes Take 50,000 Units by mouth once a week. [provider] Taking Active   ? ?  ?  ? ?  ? ? ?Patient Active Problem List  ? Diagnosis Date Noted  ? Hepatic steatosis 02/12/2022  ? Liver lesion 02/12/2022  ? Dorsal cervical fat pad 02/24/2018  ? Insulin resistance 04/21/2017  ? Morbid obesity ([place]) 04/14/2017  ? Acanthosis nigricans, acquired 04/14/2017  ? Post-traumatic headache, not intractable 11/23/2016  ? GAD (generalized anxiety disorder) 10/22/2016  ? Recurrent major depressive disorder ([place]) 03/04/2016  ? PTSD (post-traumatic stress disorder) 02/03/2016  ?  Rhinitis, allergic 11/12/2015  ? Irregular periods 07/10/2015  ? Morbid childhood obesity with BMI greater than 99th percentile for age [facility] 07/10/2015  ? Amenorrhea 07/10/2015  ? Migraine without aura and without status migrainosus, not intractable 12/19/2014  ? Tension headache 12/19/2014  ? ? ?Conditions to be addressed/monitored per PCP order:   fatty liver disease ? ?Care Plan : RN Care Manager Plan of Care   ?Updates made by Melissa Montane, RN since 03/30/2022 12:00 AM  ?  ? ?Problem: Health Management needs related to Fatty Liver Disease   ?  ? ?Long-Range Goal: Development of Plan o Care to address Health Management needs related to Fatty Liver Disease   ?Start Date: 02/16/2022  ?Expected End Date: 04/29/2022  ?Priority: High  ?Note:   ?Current Barriers:  ?Knowledge Deficits related to plan of care for management of Fatty Liver Diseas Connie Rivera is working on improving her health with diet, taking prescribed medications and weight loss. She recently started a new job and will be working remotely M-F 9-5:30.  ? ?RNCM Clinical Goal(s):  ?Patient will verbalize understanding of plan for management of Fatty Liver Disease as evidenced by attending GI appointment and verbalization of self monitoring activities ?attend all scheduled medical appointments: 05/29/22 with GI and 06/26/22 with PCP as evidenced by provider documentation in EMR        ?continue to work with Consulting civil engineer and/or Social Worker to address care management and care coordination needs related to Fatty Liver Disease as evidenced by adherence to CM Team Scheduled appointments     ?work with pharmacist to address medication management related to Fatty Liver Disease as evidenced by review of EMR and patient or pharmacist report    ?work with community resource care guide to address needs related to Limited access to food as evidenced by patient and/or community resource care guide support    through collaboration with Consulting civil engineer, provider, and care team.  ? ?Interventions: ?Inter-disciplinary care team collaboration (see longitudinal plan of care) ?Evaluation of current treatment plan related to  self management and patient's adherence to plan as established by provider ? ? ?Fatty Liver Disease  (Status: Goal on Track (progressing): YES.) Long Term Goal  ?Evaluation of current treatment plan related to  Fatty Liver Disease , Financial constraints related to  food insecurity and Mental Health Concerns  self-management and patient's adherence to plan as established by provider. ?Discussed plans with patient for ongoing care management follow up and provided patient with direct contact information for care management team ?Provided education to patient re: mediterranean diet; ?Reviewed medications with patient and discussed taking as directed; ?Reviewed scheduled/upcoming provider appointments including GI 05/29/22 and 06/26/22 with PCP; ?Discussed plans with patient for ongoing care management follow up and provided patient with direct contact information for care management team; ?Discussed the importance of drinking water, encouraged at least 64 ounces per day ?Encouraged patient to increase exercise to 150 minutes per week ? ?Patient Goals/Self-Care Activities: ?Take medications as prescribed   ?Attend all scheduled provider appointments ?Call provider office for new concerns or questions  ?Work on [employer] to include more fruits and vegetables, limiting sugar and fried foods ? ? ?  ? ? ?Follow Up:  Patient agrees to Care Plan and Follow-up. ? ?Plan: The Managed Medicaid care management team will reach out to the patient again over the next 30 days. ? ?Date/time of next scheduled RN care management/care coordination outreach:  04/29/22 @ 8:30am ? ?Lurena Joiner  RN, BSN ?[place] ?RN Care Coordinator ? ?

## 2022-04-05 DIAGNOSIS — R3989 Other symptoms and signs involving the genitourinary system: Secondary | ICD-10-CM | POA: Diagnosis not present

## 2022-04-05 DIAGNOSIS — M545 Low back pain, unspecified: Secondary | ICD-10-CM | POA: Diagnosis not present

## 2022-04-17 DIAGNOSIS — F411 Generalized anxiety disorder: Secondary | ICD-10-CM | POA: Diagnosis not present

## 2022-04-17 DIAGNOSIS — Z6841 Body Mass Index (BMI) 40.0 and over, adult: Secondary | ICD-10-CM | POA: Diagnosis not present

## 2022-04-17 DIAGNOSIS — E559 Vitamin D deficiency, unspecified: Secondary | ICD-10-CM | POA: Diagnosis not present

## 2022-04-17 DIAGNOSIS — E282 Polycystic ovarian syndrome: Secondary | ICD-10-CM | POA: Diagnosis not present

## 2022-04-29 ENCOUNTER — Other Ambulatory Visit: Payer: Self-pay | Admitting: *Deleted

## 2022-04-29 NOTE — Patient Outreach (Signed)
Care Coordination ? ?04/29/2022 ? ?Connie Rivera ?2001/11/24 ?[id number] ? ? ?Medicaid Managed Care  ? ?Unsuccessful Outreach Note ? ?04/29/2022 ?Name: Connie Rivera MRN: [id number] DOB: Jun 20, 2001 ? ?Referred by: Gwenlyn Perking, FNP ?Reason for referral : High Risk Managed Medicaid (Unsuccessful RNCM follow up telephone outreach) ? ? ?An unsuccessful telephone outreach was attempted today. The patient was referred to the case management team for assistance with care management and care coordination.  ? ?Follow Up Plan: A HIPAA compliant phone message was left for the patient providing contact information and requesting a return call.  ? ?Lurena Joiner RN, BSN ?[place] ?RN Care Coordinator ? ? ?

## 2022-04-29 NOTE — Patient Instructions (Signed)
Visit Information  Ms. Connie Rivera  - as a part of your Medicaid benefit, you are eligible for care management and care coordination services at no cost or copay. I was unable to reach you by phone today but would be happy to help you with your health related needs. Please feel free to call me @ [phone].   A member of the Managed Medicaid care management team will reach out to you again over the next 14 days.   Marquavion Venhuizen RN, BSN [place]  [facility] RN Care Coordinator   

## 2022-05-06 DIAGNOSIS — H6011 Cellulitis of right external ear: Secondary | ICD-10-CM | POA: Diagnosis not present

## 2022-05-06 DIAGNOSIS — J0100 Acute maxillary sinusitis, unspecified: Secondary | ICD-10-CM | POA: Diagnosis not present

## 2022-05-06 DIAGNOSIS — J029 Acute pharyngitis, unspecified: Secondary | ICD-10-CM | POA: Diagnosis not present

## 2022-05-06 DIAGNOSIS — H60391 Other infective otitis externa, right ear: Secondary | ICD-10-CM | POA: Diagnosis not present

## 2022-05-08 ENCOUNTER — Ambulatory Visit (INDEPENDENT_AMBULATORY_CARE_PROVIDER_SITE_OTHER): Payer: Medicaid Other | Admitting: Family Medicine

## 2022-05-08 ENCOUNTER — Encounter: Payer: Self-pay | Admitting: Family Medicine

## 2022-05-08 VITALS — BP 128/82 | HR 86 | Temp 97.8°F | Ht 70.0 in | Wt 327.6 lb

## 2022-05-08 DIAGNOSIS — H60391 Other infective otitis externa, right ear: Secondary | ICD-10-CM | POA: Diagnosis not present

## 2022-05-08 NOTE — Progress Notes (Signed)
Assessment & Plan:  1. Infective otitis externa of right ear Encouraged to use drops prescribed by urgent care earlier this week. Education provided on otitis externa.    Follow up plan: Return if symptoms worsen or fail to improve.  [name], MSN, APRN, FNP-C [facility]  Subjective:   Patient ID: Connie Rivera, female    DOB: [birth year], 21 y.o.   MRN: [id number]  HPI: Connie Rivera is a 21 y.o. female presenting on 05/08/2022 for Follow-up (Urgent care follow up 5/17[facility] /Patient states she still has sore throat and right ear pain. )  Patient reports right ear pain that started two weeks ago when she had a boil in her ear.  She applied an over-the-counter boil ease which resolved the boil.  However she continues to have right ear pain.  She reports having a sore throat 2 days ago after she slept with her mouth open, but that improved.  Denies any upper respiratory symptoms.   ROS: Negative unless specifically indicated above in HPI.   Relevant past medical history reviewed and updated as indicated.   Allergies and medications reviewed and updated.   Current Outpatient Medications:    busPIRone (BUSPAR) 10 MG tablet, TAKE ONE TABLET BY MOUTH TWICE DAILY, Disp: 60 tablet, Rfl: 1   cetirizine (ZYRTEC ALLERGY) 10 MG tablet, Take 1 tablet (10 mg total) by mouth daily., Disp: 30 tablet, Rfl: 1   medroxyPROGESTERone (PROVERA) 10 MG tablet, Take 10 mg by mouth daily., Disp: , Rfl:    metFORMIN (GLUCOPHAGE) 500 MG tablet, Take 500 mg by mouth daily with breakfast., Disp: , Rfl:    phentermine (ADIPEX-P) 37.5 MG tablet, Take 37.5 mg by mouth every morning., Disp: , Rfl:    PROAIR HFA 108 (90 Base) MCG/ACT inhaler, Inhale into the lungs., Disp: , Rfl:    Vitamin D, Ergocalciferol, (DRISDOL) 1.25 MG (50000 UNIT) CAPS capsule, Take 50,000 Units by mouth once a week., Disp: , Rfl:    cephALEXin (KEFLEX) 500 MG capsule, Take by mouth. (Patient not taking: Reported  on 05/08/2022), Disp: , Rfl:    ipratropium (ATROVENT) 0.06 % nasal spray, 2 sprays into each nostril Three (3) times a day. (Patient not taking: Reported on 05/08/2022), Disp: , Rfl:    lidocaine (XYLOCAINE) 2 % solution, 10 mL by Mouth route every three (3) hours as needed. (Patient not taking: Reported on 05/08/2022), Disp: , Rfl:    NEOMYCIN-POLYMYXIN-HYDROCORTISONE (CORTISPORIN) 1 % SOLN OTIC solution, Administer 3 drops into the left ear four (4) times a day for 10 days. (Patient not taking: Reported on 05/08/2022), Disp: , Rfl:    ondansetron (ZOFRAN) 4 MG tablet, Take 4 mg by mouth every 8 (eight) hours as needed. (Patient not taking: Reported on 03/30/2022), Disp: , Rfl:    pseudoephedrine (SUDAFED) 30 MG tablet, Take 1 tablet by mouth every 6 (six) hours. (Patient not taking: Reported on 05/08/2022), Disp: , Rfl:   Allergies  Allergen Reactions   Hydrocodone Palpitations   Pineapple Anaphylaxis and Itching   Wound Dressing Adhesive Itching and Rash   Adhesive [Tape] Rash   Latex Rash    Objective:   BP (!) 148/91   Pulse 86   Temp 97.8 F (36.6 C) (Temporal)   Ht '5\' 10"'$  (1.778 m)   Wt (!) 327 lb 9.6 oz (148.6 kg)   SpO2 100%   BMI 47.01 kg/m    Physical Exam Vitals reviewed.  Constitutional:  General: She is not in acute distress.    Appearance: Normal appearance. She is not ill-appearing, toxic-appearing or diaphoretic.  HENT:     Head: Normocephalic and atraumatic.     Right Ear: Tympanic membrane and external ear normal. Decreased hearing noted. Swelling and tenderness present. There is no impacted cerumen.     Left Ear: Tympanic membrane, ear canal and external ear normal. There is no impacted cerumen.     Nose: Nose normal. No congestion or rhinorrhea.     Right Sinus: No maxillary sinus tenderness or frontal sinus tenderness.     Left Sinus: No maxillary sinus tenderness or frontal sinus tenderness.     Mouth/Throat:     Mouth: Mucous membranes are moist.      Pharynx: Oropharynx is clear. No oropharyngeal exudate or posterior oropharyngeal erythema.  Eyes:     General: No scleral icterus.       Right eye: No discharge.        Left eye: No discharge.     Conjunctiva/sclera: Conjunctivae normal.  Cardiovascular:     Rate and Rhythm: Normal rate and regular rhythm.     Heart sounds: Normal heart sounds. No murmur heard.   No friction rub. No gallop.  Pulmonary:     Effort: Pulmonary effort is normal. No respiratory distress.     Breath sounds: Normal breath sounds. No stridor. No wheezing, rhonchi or rales.  Musculoskeletal:        General: Normal range of motion.     Cervical back: Normal range of motion.  Lymphadenopathy:     Cervical: No cervical adenopathy.  Skin:    General: Skin is warm and dry.     Capillary Refill: Capillary refill takes less than 2 seconds.  Neurological:     General: No focal deficit present.     Mental Status: She is alert and oriented to person, place, and time. Mental status is at baseline.  Psychiatric:        Mood and Affect: Mood normal.        Behavior: Behavior normal.        Thought Content: Thought content normal.        Judgment: Judgment normal.

## 2022-05-10 DIAGNOSIS — J019 Acute sinusitis, unspecified: Secondary | ICD-10-CM | POA: Diagnosis not present

## 2022-05-11 DIAGNOSIS — J329 Chronic sinusitis, unspecified: Secondary | ICD-10-CM | POA: Diagnosis not present

## 2022-05-11 DIAGNOSIS — B9689 Other specified bacterial agents as the cause of diseases classified elsewhere: Secondary | ICD-10-CM | POA: Diagnosis not present

## 2022-05-11 DIAGNOSIS — H65194 Other acute nonsuppurative otitis media, recurrent, right ear: Secondary | ICD-10-CM | POA: Diagnosis not present

## 2022-05-11 DIAGNOSIS — J309 Allergic rhinitis, unspecified: Secondary | ICD-10-CM | POA: Diagnosis not present

## 2022-05-12 ENCOUNTER — Other Ambulatory Visit: Payer: Self-pay | Admitting: *Deleted

## 2022-05-12 NOTE — Patient Outreach (Signed)
Medicaid Managed Care   Nurse Care Manager Note  05/12/2022 Name:  Connie Rivera MRN:  [id number] DOB:  [birth year]  Connie Rivera is an 21 y.o. year old female who is a primary patient of Gwenlyn Perking, FNP.  The [facility] Managed Care Coordination team was consulted for assistance with:    Fatty Liver Disease  Ms. Leisure was given information about Medicaid Managed Care Coordination team services today. Connie Rivera Patient agreed to services and verbal consent obtained.  Engaged with patient by telephone for follow up visit in response to provider referral for case management and/or care coordination services.   Assessments/Interventions:  Review of past medical history, allergies, medications, health status, including review of consultants reports, laboratory and other test data, was performed as part of comprehensive evaluation and provision of chronic care management services.  SDOH (Social Determinants of Health) assessments and interventions performed: SDOH Interventions    Flowsheet Row Most Recent Value  SDOH Interventions   Stress Interventions Intervention Not Indicated       Care Plan  Allergies  Allergen Reactions   Hydrocodone Palpitations   Pineapple Anaphylaxis and Itching   Wound Dressing Adhesive Itching and Rash   Adhesive [Tape] Rash   Latex Rash    Medications Reviewed Today     Reviewed by Melissa Montane, RN (Registered Nurse) on 05/12/22 at 1143  Med List Status: <None>   Medication Order Taking? Sig Documenting Provider Last Dose Status Informant  amoxicillin-clavulanate (AUGMENTIN) 875-125 MG tablet [id number] Yes Take 1 tablet by mouth 2 (two) times daily. [provider] Taking Active   busPIRone (BUSPAR) 10 MG tablet [id number] Yes TAKE ONE TABLET BY MOUTH TWICE DAILY Gwenlyn Perking, FNP Taking Active   butalbital-acetaminophen-caffeine (FIORICET) 50-325-40 MG tablet [id number] Yes Take 1 tablet by mouth every 4 (four) hours as needed  for headache. [provider] Taking Active   cephALEXin (KEFLEX) 500 MG capsule [id number] No Take by mouth.  Patient not taking: Reported on 05/08/2022   [provider] Not Taking Active   cetirizine (ZYRTEC ALLERGY) 10 MG tablet [id number] Yes Take 1 tablet (10 mg total) by mouth daily. Ivy Lynn, NP Taking Active   medroxyPROGESTERone (PROVERA) 10 MG tablet [id number] Yes Take 10 mg by mouth daily. [provider] Taking Active   metFORMIN (GLUCOPHAGE) 500 MG tablet [id number] Yes Take 500 mg by mouth daily with breakfast. [provider] Taking Active   NEOMYCIN-POLYMYXIN-HYDROCORTISONE (CORTISPORIN) 1 % SOLN OTIC solution [id number] No Administer 3 drops into the left ear four (4) times a day for 10 days.  Patient not taking: Reported on 05/08/2022   [provider] Not Taking Active   ondansetron (ZOFRAN) 4 MG tablet [id number] Yes Take 4 mg by mouth every 8 (eight) hours as needed. [provider] Taking Active            Med Note Adelene Amas, Patrecia Pace Jan 01, 2022 12:14 PM) Prn   phentermine (ADIPEX-P) 37.5 MG tablet [id number] Yes Take 37.5 mg by mouth every morning. [provider] Taking Active   PROAIR HFA 108 [phone] Base) MCG/ACT inhaler [id number] Yes Inhale into the lungs. [provider] Taking Active   Vitamin D, Ergocalciferol, (DRISDOL) 1.25 MG (50000 UNIT) CAPS capsule [id number] Yes Take 50,000 Units by mouth once a week. [provider] Taking Active             Patient Active Problem List   Diagnosis Date Noted  Hepatic steatosis 02/12/2022   Liver lesion 02/12/2022   Dorsal cervical fat pad 02/24/2018   Insulin resistance 04/21/2017   Morbid obesity ([place]) 04/14/2017   Acanthosis nigricans, acquired 04/14/2017   Post-traumatic headache, not intractable 11/23/2016   GAD (generalized anxiety disorder) 10/22/2016   Recurrent major depressive disorder ([place]) 03/04/2016   PTSD  (post-traumatic stress disorder) 02/03/2016   Rhinitis, allergic 11/12/2015   Irregular periods 07/10/2015   Morbid childhood obesity with BMI greater than 99th percentile for age [facility]) 07/10/2015   Amenorrhea 07/10/2015   Migraine without aura and without status migrainosus, not intractable 12/19/2014   Tension headache 12/19/2014    Conditions to be addressed/monitored per PCP order:   Fatty Liver Disease  Care Plan : [place] of Care  Updates made by Melissa Montane, RN since 05/12/2022 12:00 AM     Problem: Health Management needs related to Fatty Liver Disease      Long-Range Goal: Development of Plan o Care to address Health Management needs related to Fatty Liver Disease   Start Date: 02/16/2022  Expected End Date: 06/26/2022  Priority: High  Note:   Current Barriers:  Knowledge Deficits related to plan of care for management of Fatty Liver Diseas Ms. Creason was seen in the ED at [facility] for ear pain. She was prescribed Augmentin for inner ear infection and Butalbitol for headache. She is resting today.   RNCM Clinical Goal(s):  Patient will verbalize understanding of plan for management of Fatty Liver Disease as evidenced by attending GI appointment and verbalization of self monitoring activities attend all scheduled medical appointments: 05/29/22 with GI and 06/26/22 with PCP as evidenced by provider documentation in EMR        continue to work with [place] and/or Social Worker to address care management and care coordination needs related to Fatty Liver Disease as evidenced by adherence to CM Team Scheduled appointments     work with pharmacist to address medication management related to Fatty Liver Disease as evidenced by review of EMR and patient or pharmacist report    work with community resource care guide to address needs related to Limited access to food as evidenced by patient and/or community resource care guide support    through collaboration with Consulting civil engineer, provider, and care team.   Interventions: Inter-disciplinary care team collaboration (see longitudinal plan of care) Evaluation of current treatment plan related to  self management and patient's adherence to plan as established by provider Reviewed discharge instructions and medications with patient Provided education for managing ear infection   Fatty Liver Disease  (Status: Goal on Track (progressing): YES.) Long Term Goal  Evaluation of current treatment plan related to  Fatty Liver Disease , Financial constraints related to food insecurity and Mental Health Concerns  self-management and patient's adherence to plan as established by provider. Discussed plans with patient for ongoing care management follow up and provided patient with direct contact information for care management team Provided education to patient re: mediterranean diet; Reviewed medications with patient and discussed taking as directed; Reviewed scheduled/upcoming provider appointments including GI 05/29/22 and 06/26/22 with PCP; Discussed plans with patient for ongoing care management follow up and provided patient with direct contact information for care management team; Assessed social determinant of health barriers;  Discussed the importance of drinking water, encouraged at least 64 ounces per day Encouraged patient to increase exercise to 150 minutes per week  Patient Goals/Self-Care Activities: Take medications as prescribed  Attend all scheduled provider appointments Call provider office for new concerns or questions  Work on changing diet to include more fruits and vegetables, limiting sugar and fried foods       Follow Up:  Patient agrees to Care Plan and Follow-up.  Plan: The Managed Medicaid care management team will reach out to the patient again over the next 60 days.  Date/time of next scheduled RN care management/care coordination outreach:  06/26/22 @ 1:15pm  Lurena Joiner RN, BSN [place] RN Care Coordinator

## 2022-05-12 NOTE — Patient Instructions (Signed)
Visit Information  Connie Rivera was given information about Medicaid Managed Care team care coordination services as a part of their [facility] Medicaid benefit. Connie Rivera verbally consented to engagement with the [facility] Managed Care team.   If you are experiencing a medical emergency, please call 911 or report to your local emergency department or urgent care.   If you have a non-emergency medical problem during routine business hours, please contact your provider's office and ask to speak with a nurse.   For questions related to your Healthy [facility] health plan, please call: [phone] or visit the homepage here: [email or web address]  If you would like to schedule transportation through your Healthy [facility] plan, please call the following number at least 2 days in advance of your appointment: [phone]  For information about your ride after you set it up, call Ride Assist at [phone]. Use this number to activate a Will Call pickup, or if your transportation is late for a scheduled pickup. Use this number, too, if you need to make a change or cancel a previously scheduled reservation.  If you need transportation services right away, call [phone]. The after-hours call center is staffed 24 hours to handle ride assistance and urgent reservation requests (including discharges) 365 days a year. Urgent trips include sick visits, hospital discharge requests and life-sustaining treatment.  Call the [place] at [phone], at any time, 24 hours a day, 7 days a week. If you are in danger or need immediate medical attention call 911.  If you would like help to quit smoking, call 1-800-QUIT-NOW 279 191 0263) OR Espaol: 1-855-Djelo-Ya (6-433-295-1884) o para ms informacin haga clic aqu or Text READY to 200-400 to register via text  Connie Rivera,   Please see education materials related to Otitis Media provided by  MyChart link.  Patient verbalizes understanding of instructions and care plan provided today and agrees to view in [place]. Active MyChart status and patient understanding of how to access instructions and care plan via MyChart confirmed with patient.     Telephone follow up appointment with Managed Medicaid care management team member scheduled for:06/26/22 @ 1:15pm  Lurena Joiner RN, BSN [place] RN Care Coordinator   Following is a copy of your plan of care:  Care Plan : RN Care Manager Plan of Care  Updates made by Melissa Montane, RN since 05/12/2022 12:00 AM     Problem: Health Management needs related to Fatty Liver Disease      Long-Range Goal: Development of Plan o Care to address Health Management needs related to Fatty Liver Disease   Start Date: 02/16/2022  Expected End Date: 06/26/2022  Priority: High  Note:   Current Barriers:  Knowledge Deficits related to plan of care for management of Fatty Liver Diseas Connie Rivera was seen in the ED at [facility] for ear pain. She was prescribed Augmentin for inner ear infection and Butalbitol for headache. She is resting today.   RNCM Clinical Goal(s):  Patient will verbalize understanding of plan for management of Fatty Liver Disease as evidenced by attending GI appointment and verbalization of self monitoring activities attend all scheduled medical appointments: 05/29/22 with GI and 06/26/22 with PCP as evidenced by provider documentation in EMR        continue to work with RN Care Manager and/or Social Worker to address care management and care coordination needs related to Fatty Liver Disease as evidenced by adherence to CM Team Scheduled appointments  work with pharmacist to address medication management related to Fatty Liver Disease as evidenced by review of EMR and patient or pharmacist report    work with community resource care guide to address needs related to Limited access to food as evidenced by patient  and/or community resource care guide support    through collaboration with Consulting civil engineer, provider, and care team.   Interventions: Inter-disciplinary care team collaboration (see longitudinal plan of care) Evaluation of current treatment plan related to  self management and patient's adherence to plan as established by provider Reviewed discharge instructions and medications with patient Provided education for managing ear infection   Fatty Liver Disease  (Status: Goal on Track (progressing): YES.) Long Term Goal  Evaluation of current treatment plan related to  Fatty Liver Disease , Financial constraints related to food insecurity and Mental Health Concerns  self-management and patient's adherence to plan as established by provider. Discussed plans with patient for ongoing care management follow up and provided patient with direct contact information for care management team Provided education to patient re: mediterranean diet; Reviewed medications with patient and discussed taking as directed; Reviewed scheduled/upcoming provider appointments including GI 05/29/22 and 06/26/22 with PCP; Discussed plans with patient for ongoing care management follow up and provided patient with direct contact information for care management team; Assessed social determinant of health barriers;  Discussed the importance of drinking water, encouraged at least 64 ounces per day Encouraged patient to increase exercise to 150 minutes per week  Patient Goals/Self-Care Activities: Take medications as prescribed   Attend all scheduled provider appointments Call provider office for new concerns or questions  Work on changing diet to include more fruits and vegetables, limiting sugar and fried foods

## 2022-05-13 ENCOUNTER — Telehealth: Payer: Self-pay

## 2022-05-13 NOTE — Telephone Encounter (Signed)
Transition Care Management Unsuccessful Follow-up Telephone Call  Date of discharge and from where:  05/12/2022 from [facility]  Attempts:  1st Attempt  Reason for unsuccessful TCM follow-up call:  Left voice message

## 2022-05-14 NOTE — Telephone Encounter (Signed)
Transition Care Management Unsuccessful Follow-up Telephone Call  Date of discharge and from where:  05/12/2022 from [facility]  Attempts:  2nd Attempt  Reason for unsuccessful TCM follow-up call:  Left voice message

## 2022-05-15 NOTE — Telephone Encounter (Signed)
Transition Care Management Unsuccessful Follow-up Telephone Call  Date of discharge and from where:  05/12/2022 from [facility]  Attempts:  3rd Attempt  Reason for unsuccessful TCM follow-up call:  Unable to reach patient

## 2022-05-20 DIAGNOSIS — Z3200 Encounter for pregnancy test, result unknown: Secondary | ICD-10-CM | POA: Diagnosis not present

## 2022-05-26 DIAGNOSIS — Z20822 Contact with and (suspected) exposure to covid-19: Secondary | ICD-10-CM | POA: Diagnosis not present

## 2022-05-26 DIAGNOSIS — E669 Obesity, unspecified: Secondary | ICD-10-CM | POA: Diagnosis not present

## 2022-05-26 DIAGNOSIS — J45909 Unspecified asthma, uncomplicated: Secondary | ICD-10-CM | POA: Diagnosis not present

## 2022-05-26 DIAGNOSIS — J189 Pneumonia, unspecified organism: Secondary | ICD-10-CM | POA: Diagnosis not present

## 2022-05-26 DIAGNOSIS — Z91018 Allergy to other foods: Secondary | ICD-10-CM | POA: Diagnosis not present

## 2022-05-26 DIAGNOSIS — Z91048 Other nonmedicinal substance allergy status: Secondary | ICD-10-CM | POA: Diagnosis not present

## 2022-05-26 DIAGNOSIS — Z79899 Other long term (current) drug therapy: Secondary | ICD-10-CM | POA: Diagnosis not present

## 2022-05-26 DIAGNOSIS — Z885 Allergy status to narcotic agent status: Secondary | ICD-10-CM | POA: Diagnosis not present

## 2022-05-26 DIAGNOSIS — Z91040 Latex allergy status: Secondary | ICD-10-CM | POA: Diagnosis not present

## 2022-05-26 DIAGNOSIS — K760 Fatty (change of) liver, not elsewhere classified: Secondary | ICD-10-CM | POA: Diagnosis not present

## 2022-05-26 DIAGNOSIS — R059 Cough, unspecified: Secondary | ICD-10-CM | POA: Diagnosis not present

## 2022-05-26 DIAGNOSIS — Z6841 Body Mass Index (BMI) 40.0 and over, adult: Secondary | ICD-10-CM | POA: Diagnosis not present

## 2022-05-26 DIAGNOSIS — J028 Acute pharyngitis due to other specified organisms: Secondary | ICD-10-CM | POA: Diagnosis not present

## 2022-05-26 DIAGNOSIS — J029 Acute pharyngitis, unspecified: Secondary | ICD-10-CM | POA: Diagnosis not present

## 2022-05-26 DIAGNOSIS — E282 Polycystic ovarian syndrome: Secondary | ICD-10-CM | POA: Diagnosis not present

## 2022-05-26 DIAGNOSIS — F17210 Nicotine dependence, cigarettes, uncomplicated: Secondary | ICD-10-CM | POA: Diagnosis not present

## 2022-05-27 ENCOUNTER — Telehealth: Payer: Self-pay

## 2022-05-27 NOTE — Telephone Encounter (Signed)
Transition Care Management Unsuccessful Follow-up Telephone Call  Date of discharge and from where:  05/26/2022 from [facility]  Attempts:  1st Attempt  Reason for unsuccessful TCM follow-up call:  Left voice message

## 2022-05-28 NOTE — Telephone Encounter (Signed)
Transition Care Management Unsuccessful Follow-up Telephone Call  Date of discharge and from where:  05/26/2022 from [facility]  Attempts:  2nd Attempt  Reason for unsuccessful TCM follow-up call:  Left voice message

## 2022-05-29 DIAGNOSIS — R7989 Other specified abnormal findings of blood chemistry: Secondary | ICD-10-CM | POA: Diagnosis not present

## 2022-05-29 DIAGNOSIS — K760 Fatty (change of) liver, not elsewhere classified: Secondary | ICD-10-CM | POA: Diagnosis not present

## 2022-05-29 NOTE — Telephone Encounter (Signed)
Transition Care Management Follow-up Telephone Call Date of discharge and from where: 05/26/2022 from [facility] How have you been since you were released from the hospital? Patient is feeling some better and did not have any questions or concerns at this time.  Any questions or concerns? No  Items Reviewed: Did the pt receive and understand the discharge instructions provided? Yes  Medications obtained and verified? Yes  Other? No  Any new allergies since your discharge? No  Dietary orders reviewed? No Do you have support at home? Yes   Functional Questionnaire: (I = Independent and D = Dependent) ADLs: I  Bathing/Dressing- I  Meal Prep- I  Eating- I  Maintaining continence- I  Transferring/Ambulation- I  Managing Meds- I   Follow up appointments reviewed:  PCP Hospital f/u appt confirmed? Yes  Scheduled to see Marjorie Smolder, NP on 06/26/2022 @ 8:00am. [place] [facility] f/u appt confirmed? No   Are transportation arrangements needed? No  If their condition worsens, is the pt aware to call PCP or go to the Emergency Dept.? Yes Was the patient provided with contact information for the PCP's office or ED? Yes Was to pt encouraged to call back with questions or concerns? Yes

## 2022-06-02 DIAGNOSIS — E559 Vitamin D deficiency, unspecified: Secondary | ICD-10-CM | POA: Diagnosis not present

## 2022-06-02 DIAGNOSIS — E282 Polycystic ovarian syndrome: Secondary | ICD-10-CM | POA: Diagnosis not present

## 2022-06-26 ENCOUNTER — Ambulatory Visit: Payer: Medicaid Other | Admitting: Family Medicine

## 2022-06-26 ENCOUNTER — Other Ambulatory Visit: Payer: Self-pay

## 2022-06-26 ENCOUNTER — Encounter: Payer: Self-pay | Admitting: Family Medicine

## 2022-06-26 VITALS — BP 109/69 | HR 78 | Temp 97.3°F | Ht 70.0 in | Wt 327.1 lb

## 2022-06-26 DIAGNOSIS — F411 Generalized anxiety disorder: Secondary | ICD-10-CM | POA: Diagnosis not present

## 2022-06-26 DIAGNOSIS — F3342 Major depressive disorder, recurrent, in full remission: Secondary | ICD-10-CM

## 2022-06-26 MED ORDER — FLUOXETINE HCL 10 MG PO CAPS: 10.0000 mg | ORAL_CAPSULE | Freq: Every day | ORAL | 1 refills | Status: DC

## 2022-06-26 NOTE — Progress Notes (Signed)
Established Patient Office Visit  Subjective   Patient ID: Connie Rivera, female    DOB: Dec 24, 2000  Age: 21 y.o. MRN: [id number]  Chief Complaint  Patient presents with   Anxiety   Medical Management of Chronic Issues    Anxiety     Yi is here for a follow up of anxiety and depression. She has been on buspar about 6 months. She was doing well on it for the most part but was having some panic attacks. She has run out of this about a week ago. She would like to discuss going back on prozac. She was on this a few years ago and felt like it was the most helpful medication that she has been on.   She has been following with bariatrics. She has been on phentermine for 4 months. She reports doing well so far. She has lost about 7 lbs.       06/26/2022    8:10 AM 05/08/2022    8:12 AM 03/18/2022    8:42 AM  Depression screen PHQ 2/9  Decreased Interest 0 0 0  Down, Depressed, Hopeless 2 0 0  PHQ - 2 Score 2 0 0  Altered sleeping 0 3 0  Tired, decreased energy 0 0 0  Change in appetite 0 0 0  Feeling bad or failure about yourself  2 0 0  Trouble concentrating 0 0 0  Moving slowly or fidgety/restless 0 0 0  Suicidal thoughts 0 0 0  PHQ-9 Score 4 3 0  Difficult doing work/chores Somewhat difficult Not difficult at all Not difficult at all      06/26/2022    8:10 AM 05/08/2022    8:12 AM 03/18/2022    8:43 AM 03/10/2022    8:17 AM  GAD 7 : Generalized Anxiety Score  Nervous, Anxious, on Edge 0 0 1 1  Control/stop worrying 0 0 0 1  Worry too much - different things 0 '1 1 1  '$ Trouble relaxing 0 0 0 1  Restless 0 0 0 0  Easily annoyed or irritable '3 1 1 1  '$ Afraid - awful might happen 0 0 1 1  Total GAD 7 Score '3 2 4 6  '$ Anxiety Difficulty Somewhat difficult Not difficult at all Somewhat difficult Somewhat difficult      Past Medical History:  Diagnosis Date   Amenorrhea 07/10/2015   Asthma    Depression    Irregular periods 07/10/2015   Obesity    Scoliosis    Sleep  apnea       ROS As per HPI.    Objective:     BP 109/69   Pulse 78   Temp (!) 97.3 F (36.3 C) (Temporal)   Ht '5\' 10"'$  (1.778 m)   Wt (!) 327 lb 2 oz (148.4 kg)   SpO2 98%   BMI 46.94 kg/m  Wt Readings from Last 3 Encounters:  06/26/22 (!) 327 lb 2 oz (148.4 kg)  05/08/22 (!) 327 lb 9.6 oz (148.6 kg)  03/18/22 (!) 335 lb 6 oz (152.1 kg)      Physical Exam Vitals and nursing note reviewed.  Constitutional:      General: She is not in acute distress.    Appearance: She is obese. She is not ill-appearing, toxic-appearing or diaphoretic.  HENT:     Head: Normocephalic and atraumatic.  Cardiovascular:     Rate and Rhythm: Normal rate and regular rhythm.     Heart sounds: Normal heart sounds.  No murmur heard. Pulmonary:     Effort: Pulmonary effort is normal.     Breath sounds: Normal breath sounds.  Musculoskeletal:     Right lower leg: No edema.     Left lower leg: No edema.  Skin:    General: Skin is warm and dry.  Neurological:     General: No focal deficit present.     Mental Status: She is alert and oriented to person, place, and time.  Psychiatric:        Mood and Affect: Mood normal.        Behavior: Behavior normal.    No results found for any visits on 06/26/22.    The ASCVD Risk score (Arnett DK, et al., 2019) failed to calculate for the following reasons:   The 2019 ASCVD risk score is only valid for ages 31 to 41    Assessment & Plan:   Talyah was seen today for anxiety and medical management of chronic issues.  Diagnoses and all orders for this visit:  Recurrent major depressive disorder, in full remission ([place]) GAD (generalized anxiety disorder) Patient has discontinued buspar herself. She would like to restart prozac. I agree with this plan. Denies SI.  -     FLUoxetine (PROZAC) 10 MG capsule; Take 1 capsule (10 mg total) by mouth daily.  Morbid obesity ([facility]) Down 7 lbs. On phentermine through bariatric clinic. Diet and exercise.      Return in about 3 months (around 09/26/2022) for chronic follow up.  The patient indicates understanding of these issues and agrees with the plan.     Gwenlyn Perking, FNP

## 2022-06-26 NOTE — Patient Instructions (Signed)

## 2022-06-29 ENCOUNTER — Other Ambulatory Visit: Payer: Self-pay | Admitting: *Deleted

## 2022-06-29 NOTE — Patient Outreach (Signed)
  Medicaid Managed Care   Unsuccessful Attempt Note   06/29/2022 Name: Connie Rivera MRN: [id number] DOB: 29-Jun-2001  Referred by: Gwenlyn Perking, FNP Reason for referral : High Risk Managed Medicaid (Unsuccessful RNCM follow up telephone outreach)   An unsuccessful telephone outreach was attempted today. The patient was referred to the case management team for assistance with care management and care coordination.    Follow Up Plan: A HIPAA compliant phone message was left for the patient providing contact information and requesting a return call.    Lurena Joiner RN, BSN [place]  [employer] manager

## 2022-06-29 NOTE — Patient Instructions (Signed)
Visit Information  Ms. [name]  - as a part of your Medicaid benefit, you are eligible for care management and care coordination services at no cost or copay. I was unable to reach you by phone today but would be happy to help you with your health related needs. Please feel free to call me @ [phone].   A member of the Managed Medicaid care management team will reach out to you again over the next 14 days.   Lurena Joiner RN, BSN [place]  [employer] manager

## 2022-07-03 DIAGNOSIS — K760 Fatty (change of) liver, not elsewhere classified: Secondary | ICD-10-CM | POA: Diagnosis not present

## 2022-07-03 DIAGNOSIS — R7989 Other specified abnormal findings of blood chemistry: Secondary | ICD-10-CM | POA: Diagnosis not present

## 2022-07-10 ENCOUNTER — Other Ambulatory Visit: Payer: Self-pay | Admitting: *Deleted

## 2022-07-10 NOTE — Patient Instructions (Signed)
Visit Information  Ms. Connie Rivera  - as a part of your Medicaid benefit, you are eligible for care management and care coordination services at no cost or copay. I was unable to reach you by phone today but would be happy to help you with your health related needs. Please feel free to call me @ [phone].   A member of the Managed Medicaid care management team will reach out to you again over the next 14 days.   Lurena Joiner RN, BSN [place]  [employer] manager

## 2022-07-10 NOTE — Patient Outreach (Signed)
Care Coordination  07/10/2022  Connie Rivera [birth year] [id number]   Medicaid Managed Care   Unsuccessful Outreach Note  07/10/2022 Name: Connie Rivera MRN: [id number] DOB: March 30, 2001  Referred by: Gwenlyn Perking, FNP Reason for referral : High Risk Managed Medicaid (Unsuccessful RNCM follow up, 2nd attempt)   A second unsuccessful telephone outreach was attempted today. The patient was referred to the case management team for assistance with care management and care coordination.   Follow Up Plan: A HIPAA compliant phone message was left for the patient providing contact information and requesting a return call.   Lurena Joiner RN, BSN [place]  [employer] manager

## 2022-07-13 ENCOUNTER — Telehealth: Payer: Self-pay | Admitting: Family Medicine

## 2022-07-13 NOTE — Telephone Encounter (Signed)
..   Medicaid Managed Care   Unsuccessful Outreach Note  07/13/2022 Name: Connie Rivera MRN: [id number] DOB: 05-14-2001  Referred by: Gwenlyn Perking, FNP Reason for referral : High Risk Managed Medicaid (I called the patient today to get her rescheduled with the MM RNCM. I left my name and number on her VM.)   Third unsuccessful telephone outreach was attempted today. The patient was referred to the case management team for assistance with care management and care coordination. The patient's primary care provider has been notified of our unsuccessful attempts to make or maintain contact with the patient. The care management team is pleased to engage with this patient at any time in the future should he/she be interested in assistance from the care management team.   Follow Up Plan: We have been unable to make contact with the patient for follow up. The care management team is available to follow up with the patient after provider conversation with the patient regarding recommendation for care management engagement and subsequent re-referral to the care management team.    [place], [place]

## 2022-07-15 ENCOUNTER — Other Ambulatory Visit: Payer: Self-pay | Admitting: *Deleted

## 2022-07-15 NOTE — Patient Outreach (Signed)
Care Coordination  07/15/2022  [name] [birth year] [id number]   Medicaid Managed Care   Unsuccessful Outreach Note  07/15/2022 Name: Connie Rivera MRN: [id number] DOB: 2001-08-01  Referred by: Gwenlyn Perking, FNP Reason for referral : Case Closure (RNCM performing Case Closure)   Three unsuccessful telephone outreach attempts have been made. The patient was referred to the case management team for assistance with care management and care coordination. The patient's primary care provider has been notified of our unsuccessful attempts to make or maintain contact with the patient. The care management team is pleased to engage with this patient at any time in the future should he/she be interested in assistance from the care management team.   Follow Up Plan: We have been unable to make contact with the patient for follow up. The care management team is available to follow up with the patient after provider conversation with the patient regarding recommendation for care management engagement and subsequent re-referral to the care management team.   Lurena Joiner RN, BSN [place] RN Care Coordinator

## 2022-07-16 DIAGNOSIS — R7309 Other abnormal glucose: Secondary | ICD-10-CM | POA: Diagnosis not present

## 2022-09-03 DIAGNOSIS — R7303 Prediabetes: Secondary | ICD-10-CM | POA: Diagnosis not present

## 2022-09-03 DIAGNOSIS — N912 Amenorrhea, unspecified: Secondary | ICD-10-CM | POA: Diagnosis not present

## 2022-09-25 DIAGNOSIS — N912 Amenorrhea, unspecified: Secondary | ICD-10-CM | POA: Diagnosis not present

## 2022-09-30 ENCOUNTER — Ambulatory Visit: Payer: Medicaid Other | Admitting: Family Medicine

## 2022-10-08 ENCOUNTER — Telehealth: Payer: Self-pay | Admitting: Family Medicine

## 2022-10-08 NOTE — Telephone Encounter (Signed)
I spoke with pt and advised most cold and allergy otc medications are safe to take. Pt isn't pregnant or on any HTN medications so advised pt if she has any side effects to stop immediately or she could try some mucinex otc instead. Pt voiced understanding.

## 2022-10-09 DIAGNOSIS — J029 Acute pharyngitis, unspecified: Secondary | ICD-10-CM | POA: Diagnosis not present

## 2022-10-09 DIAGNOSIS — F17210 Nicotine dependence, cigarettes, uncomplicated: Secondary | ICD-10-CM | POA: Diagnosis not present

## 2022-10-09 DIAGNOSIS — E669 Obesity, unspecified: Secondary | ICD-10-CM | POA: Diagnosis not present

## 2022-10-09 DIAGNOSIS — J45909 Unspecified asthma, uncomplicated: Secondary | ICD-10-CM | POA: Diagnosis not present

## 2022-10-09 DIAGNOSIS — Z720 Tobacco use: Secondary | ICD-10-CM | POA: Diagnosis not present

## 2022-10-09 DIAGNOSIS — Z20822 Contact with and (suspected) exposure to covid-19: Secondary | ICD-10-CM | POA: Diagnosis not present

## 2022-10-09 DIAGNOSIS — Z6841 Body Mass Index (BMI) 40.0 and over, adult: Secondary | ICD-10-CM | POA: Diagnosis not present

## 2022-10-09 DIAGNOSIS — R519 Headache, unspecified: Secondary | ICD-10-CM | POA: Diagnosis not present

## 2022-10-13 ENCOUNTER — Encounter: Payer: Self-pay | Admitting: Nurse Practitioner

## 2022-10-13 ENCOUNTER — Ambulatory Visit (INDEPENDENT_AMBULATORY_CARE_PROVIDER_SITE_OTHER): Payer: Medicaid Other

## 2022-10-13 ENCOUNTER — Ambulatory Visit: Payer: Medicaid Other | Admitting: Nurse Practitioner

## 2022-10-13 VITALS — BP 132/82 | Temp 98.0°F | Ht 70.0 in | Wt 316.0 lb

## 2022-10-13 DIAGNOSIS — R062 Wheezing: Secondary | ICD-10-CM | POA: Diagnosis not present

## 2022-10-13 DIAGNOSIS — R0989 Other specified symptoms and signs involving the circulatory and respiratory systems: Secondary | ICD-10-CM | POA: Diagnosis not present

## 2022-10-13 DIAGNOSIS — R0789 Other chest pain: Secondary | ICD-10-CM | POA: Diagnosis not present

## 2022-10-13 MED ORDER — PREDNISONE 10 MG (21) PO TBPK: ORAL_TABLET | ORAL | 0 refills | Status: DC

## 2022-10-13 MED ORDER — ALBUTEROL SULFATE HFA 108 (90 BASE) MCG/ACT IN AERS: 2.0000 | INHALATION_SPRAY | Freq: Four times a day (QID) | RESPIRATORY_TRACT | 5 refills | Status: DC | PRN

## 2022-10-13 NOTE — Patient Instructions (Signed)

## 2022-10-13 NOTE — Progress Notes (Signed)
Acute Office Visit  Subjective:     Patient ID: Connie Rivera, female    DOB: 2001-07-19, 21 y.o.   MRN: [id number]  Chief Complaint  Patient presents with   Cough   Wheezing    Cough This is a recurrent problem. The current episode started in the past 7 days. The problem has been gradually worsening. The problem occurs constantly. The cough is Productive of sputum. Associated symptoms include nasal congestion and wheezing. Pertinent negatives include no chest pain, chills, ear congestion, headaches, rash or shortness of breath. The treatment provided moderate relief. Her past medical history is significant for asthma.  Wheezing  This is a new problem. The problem occurs constantly. The problem has been unchanged. Associated symptoms include coughing. Pertinent negatives include no chest pain, chills, headaches, rash or shortness of breath. She has tried oral steroids, beta agonist inhalers and OTC inhaler for the symptoms. The treatment provided mild relief. Her past medical history is significant for asthma.     Review of Systems  Constitutional: Negative.  Negative for chills.  HENT:  Positive for congestion.   Respiratory:  Positive for cough and wheezing. Negative for shortness of breath.   Cardiovascular: Negative.  Negative for chest pain.  Skin: Negative.  Negative for itching and rash.  Neurological:  Negative for headaches.  All other systems reviewed and are negative.       Objective:    BP 132/82   Temp 98 F (36.7 C)   Ht '5\' 10"'$  (1.778 m)   Wt (!) 316 lb (143.3 kg)   SpO2 95%   BMI 45.34 kg/m  BP Readings from Last 3 Encounters:  10/13/22 132/82  06/26/22 109/69  05/08/22 128/82      Physical Exam Vitals and nursing note reviewed.  Constitutional:      Appearance: Normal appearance. She is obese.  HENT:     Right Ear: External ear normal.     Left Ear: External ear normal.     Nose: Congestion present.  Eyes:     Conjunctiva/sclera:  Conjunctivae normal.  Cardiovascular:     Rate and Rhythm: Normal rate and regular rhythm.     Pulses: Normal pulses.     Heart sounds: Normal heart sounds.  Pulmonary:     Effort: Pulmonary effort is normal.     Breath sounds: Normal breath sounds.  Abdominal:     General: Bowel sounds are normal.  Skin:    General: Skin is warm.     Findings: No erythema or rash.  Neurological:     General: No focal deficit present.     Mental Status: She is alert and oriented to person, place, and time.     No results found for any visits on 10/13/22.      Assessment & Plan:  Patient presents with recurrent cough, congestion and wheezing in the past few weeks.educated patient to  Take meds as prescribed - Use a cool mist humidifier  -chest x-ray completed results pending -Use saline nose sprays frequently -Force fluids -For fever or aches or pains- take Tylenol or ibuprofen. -If symptoms do not improve, she may need to be COVID tested to rule this out Follow up with worsening unresolved symptoms  Problem List Items Addressed This Visit   None Visit Diagnoses     Chest congestion    -  Primary   Relevant Medications   albuterol (VENTOLIN HFA) 108 (90 Base) MCG/ACT inhaler   predniSONE (STERAPRED UNI-PAK 21 TAB)  10 MG (21) TBPK tablet   Other Relevant Orders   DG Chest 2 View (Completed)   Wheezing       Relevant Medications   albuterol (VENTOLIN HFA) 108 (90 Base) MCG/ACT inhaler   predniSONE (STERAPRED UNI-PAK 21 TAB) 10 MG (21) TBPK tablet       Meds ordered this encounter  Medications   albuterol (VENTOLIN HFA) 108 (90 Base) MCG/ACT inhaler    Sig: Inhale 2 puffs into the lungs every 6 (six) hours as needed for wheezing or shortness of breath.    Dispense:  8 g    Refill:  5    Order Specific Question:   Supervising Provider    Answer:   Jeneen Rinks   predniSONE (STERAPRED UNI-PAK 21 TAB) 10 MG (21) TBPK tablet    Sig: 6 tablet by mouth day 1, 5 tab let day  2, 4 tablet day 3, 3 day 4, 2 tablet day 5, 1 tablet day 6    Dispense:  1 each    Refill:  0    Order Specific Question:   Supervising Provider    Answer:   Jeneen Rinks    Return if symptoms worsen or fail to improve.  Ivy Lynn, NP

## 2022-10-14 ENCOUNTER — Telehealth: Payer: Self-pay | Admitting: Family Medicine

## 2022-10-14 NOTE — Telephone Encounter (Signed)
Pt calling again. She is aware that Ar[facility] Fillers has not address the xray results. She is aware a nurse will call back when done. Pt is thinking that another rx will be called in to the pharmacy if she has pneumonia.

## 2022-10-15 NOTE — Telephone Encounter (Signed)
Patient calling back checking on her Xray and she states she is no better and was seen on Tuesday. Please call P# [phone].

## 2022-10-16 ENCOUNTER — Ambulatory Visit (INDEPENDENT_AMBULATORY_CARE_PROVIDER_SITE_OTHER): Payer: Medicaid Other | Admitting: Nurse Practitioner

## 2022-10-16 ENCOUNTER — Encounter: Payer: Self-pay | Admitting: Nurse Practitioner

## 2022-10-16 DIAGNOSIS — R052 Subacute cough: Secondary | ICD-10-CM | POA: Diagnosis not present

## 2022-10-16 DIAGNOSIS — J040 Acute laryngitis: Secondary | ICD-10-CM | POA: Diagnosis not present

## 2022-10-16 MED ORDER — BENZONATATE 100 MG PO CAPS: 100.0000 mg | ORAL_CAPSULE | Freq: Three times a day (TID) | ORAL | 1 refills | Status: DC | PRN

## 2022-10-16 NOTE — Progress Notes (Signed)
Virtual Visit  Note Due to COVID-19 pandemic this visit was conducted virtually. This visit type was conducted due to national recommendations for restrictions regarding the COVID-19 Pandemic (e.g. social distancing, sheltering in place) in an effort to limit this patient's exposure and mitigate transmission in our community. All issues noted in this document were discussed and addressed.  A physical exam was not performed with this format.  I connected with Connie Rivera on 10/16/22 at 2:37 PM by telephone and verified that I am speaking with the correct person using two identifiers. Connie Rivera is currently located at home during visit. The provider, Ivy Lynn, NP is located in their office at time of visit.  I discussed the limitations, risks, security and privacy concerns of performing an evaluation and management service by telephone and the availability of in person appointments. I also discussed with the patient that there may be a patient responsible charge related to this service. The patient expressed understanding and agreed to proceed.   History and Present Illness:  Cough This is a recurrent problem. The current episode started in the past 7 days. The problem has been unchanged. The problem occurs constantly. The cough is Non-productive. Pertinent negatives include no chest pain, chills, ear congestion, ear pain, fever, headaches, nasal congestion or rash. Exacerbated by: Talking. She has tried oral steroids for the symptoms. The treatment provided moderate relief.   Patient was seen and treated for laryngitis with steroids.  Patient reports symptoms are improving but the cough is not resolving.  She denies fever, body aches, symptoms of flu and COVID.   Review of Systems  Constitutional: Negative.  Negative for chills, fever and malaise/fatigue.  HENT: Negative.  Negative for ear discharge and ear pain.   Respiratory:  Positive for cough.        Hoarseness   Cardiovascular:  Negative for chest pain.  Skin: Negative.  Negative for itching and rash.  Neurological:  Negative for headaches.  All other systems reviewed and are negative.     Observations/Objective: Televisit patient not in distress  Assessment and Plan: Patient presents with symptoms laryngitis with hoarseness, and cough for the past few days.  Symptoms are not improving.  Advised patient to complete prednisone as prescribed, Tessalon Perles Rx sent to pharmacy for cough - Use a cool mist humidifier  -Use saline nose sprays frequently -Force fluids -For fever or aches or pains- take Tylenol or ibuprofen. -If symptoms do not improve, she may need to be COVID tested to rule this out   Follow Up Instructions: Follow-up with unresolved symptoms.    I discussed the assessment and treatment plan with the patient. The patient was provided an opportunity to ask questions and all were answered. The patient agreed with the plan and demonstrated an understanding of the instructions.   The patient was advised to call back or seek an in-person evaluation if the symptoms worsen or if the condition fails to improve as anticipated.  The above assessment and management plan was discussed with the patient. The patient verbalized understanding of and has agreed to the management plan. Patient is aware to call the clinic if symptoms persist or worsen. Patient is aware when to return to the clinic for a follow-up visit. Patient educated on when it is appropriate to go to the emergency department.   Time call ended: 2:50 PM  I provided 13 minutes of  non face-to-face time during this encounter.    Ivy Lynn, NP

## 2022-10-16 NOTE — Patient Instructions (Signed)
Laryngitis  Laryngitis is irritation and swelling (inflammation) of your vocal cords. It causes your voice to sound hoarse and may cause you to lose your voice. Depending on the cause, this condition may go away after a short time or may last for more than 3 weeks. Treatment often involves resting your voice and using medicines to soothe your throat. What are the causes? Laryngitis that lasts for a short time may be caused by: An infection caused by a virus. Lots of talking, yelling, or singing. This is also called vocal strain. An infection caused by bacteria. Laryngitis that lasts for more than 3 weeks can be caused by: Lots of talking, yelling, or singing. An injury to the vocal cords. Acid reflux. Allergies. Sinus infection. Mucus draining from the nose down the throat (postnasal drip). Smoking. Drinking too much alcohol. Breathing in chemicals or dust. Having growths on the vocal cords. What increases the risk? Smoking. Drinking too much alcohol. Having allergies. Breathing in fumes at work. What are the signs or symptoms? A change in your voice. It may sound low and hoarse. Loss of voice. Dry cough. Sore throat. Dry throat. Stuffy nose. How is this treated? Treatment depends on what is causing the laryngitis. Usually, treatment includes: Resting your voice. Using medicines to soothe your throat. If your laryngitis is caused by an infection from bacteria, you may need to take antibiotics. If your laryngitis is caused by a growth on your vocal cords, you may need to have a surgery to remove it. Follow these instructions at home: Medicines Take over-the-counter and prescription medicines only as told by your doctor. If you were prescribed an antibiotic medicine, take it as told by your doctor. Do not stop taking it even if you start to feel better. Use throat lozenges or sprays to soothe your throat as told by your doctor. General instructions  Talk as little as  possible. To do this: Avoid whispering. Write instead of talking. Do this until your voice is back to normal. Rinse your mouth (gargle) with a salt water mixture 3-4 times a day or as needed. To make salt water, dissolve -1 tsp (3-6 g) of salt in 1 cup (237 mL) of warm water. Do not swallow this mixture. Drink enough fluid to keep your pee (urine) pale yellow. Breathe in moist air. Use a humidifier if you live in a dry climate. Do not smoke or use any products that contain nicotine or tobacco. If you need help quitting, ask your doctor. Contact a doctor if: You have a fever. Your pain is worse. Your symptoms do not get better in 2 weeks. Get help right away if: You cough up blood. You have trouble swallowing. You have trouble breathing. Summary Laryngitis is inflammation of your vocal cords. This condition causes your voice to sound low and hoarse. Rest your voice by talking as little as possible. Also avoid whispering. Get help right away if you have trouble swallowing or breathing or if you cough up blood. This information is not intended to replace advice given to you by your health care provider. Make sure you discuss any questions you have with your health care provider. Document Revised: 02/24/2021 Document Reviewed: 02/24/2021 Elsevier Patient Education  [place]

## 2022-10-20 NOTE — Telephone Encounter (Signed)
Please advise xray

## 2022-10-20 NOTE — Telephone Encounter (Signed)
Chest x-ray from 10/12/2022  Is negative

## 2022-10-20 NOTE — Telephone Encounter (Signed)
Patient aware and verbalized understanding. °

## 2022-10-22 DIAGNOSIS — J302 Other seasonal allergic rhinitis: Secondary | ICD-10-CM | POA: Diagnosis not present

## 2022-10-22 DIAGNOSIS — E282 Polycystic ovarian syndrome: Secondary | ICD-10-CM | POA: Diagnosis not present

## 2022-10-22 DIAGNOSIS — R7989 Other specified abnormal findings of blood chemistry: Secondary | ICD-10-CM | POA: Diagnosis not present

## 2022-10-22 DIAGNOSIS — K760 Fatty (change of) liver, not elsewhere classified: Secondary | ICD-10-CM | POA: Diagnosis not present

## 2022-10-22 DIAGNOSIS — K769 Liver disease, unspecified: Secondary | ICD-10-CM | POA: Diagnosis not present

## 2022-10-22 DIAGNOSIS — Z6841 Body Mass Index (BMI) 40.0 and over, adult: Secondary | ICD-10-CM | POA: Diagnosis not present

## 2022-10-22 DIAGNOSIS — E559 Vitamin D deficiency, unspecified: Secondary | ICD-10-CM | POA: Diagnosis not present

## 2022-10-22 DIAGNOSIS — E782 Mixed hyperlipidemia: Secondary | ICD-10-CM | POA: Diagnosis not present

## 2022-11-04 DIAGNOSIS — H5213 Myopia, bilateral: Secondary | ICD-10-CM | POA: Diagnosis not present

## 2023-04-14 DIAGNOSIS — Z6841 Body Mass Index (BMI) 40.0 and over, adult: Secondary | ICD-10-CM | POA: Diagnosis not present

## 2023-04-14 DIAGNOSIS — Z658 Other specified problems related to psychosocial circumstances: Secondary | ICD-10-CM | POA: Diagnosis not present

## 2023-04-14 DIAGNOSIS — I10 Essential (primary) hypertension: Secondary | ICD-10-CM | POA: Diagnosis not present

## 2023-05-10 DIAGNOSIS — E559 Vitamin D deficiency, unspecified: Secondary | ICD-10-CM | POA: Diagnosis not present

## 2023-05-10 DIAGNOSIS — K760 Fatty (change of) liver, not elsewhere classified: Secondary | ICD-10-CM | POA: Diagnosis not present

## 2023-05-10 DIAGNOSIS — Z6841 Body Mass Index (BMI) 40.0 and over, adult: Secondary | ICD-10-CM | POA: Diagnosis not present

## 2023-05-10 DIAGNOSIS — J45909 Unspecified asthma, uncomplicated: Secondary | ICD-10-CM | POA: Diagnosis not present

## 2023-05-10 DIAGNOSIS — E782 Mixed hyperlipidemia: Secondary | ICD-10-CM | POA: Diagnosis not present

## 2023-05-10 DIAGNOSIS — E282 Polycystic ovarian syndrome: Secondary | ICD-10-CM | POA: Diagnosis not present

## 2023-06-11 DIAGNOSIS — R1030 Lower abdominal pain, unspecified: Secondary | ICD-10-CM | POA: Diagnosis not present

## 2023-06-11 DIAGNOSIS — O219 Vomiting of pregnancy, unspecified: Secondary | ICD-10-CM | POA: Diagnosis not present

## 2023-06-11 DIAGNOSIS — Z3A01 Less than 8 weeks gestation of pregnancy: Secondary | ICD-10-CM | POA: Diagnosis not present

## 2023-06-11 DIAGNOSIS — O26891 Other specified pregnancy related conditions, first trimester: Secondary | ICD-10-CM | POA: Diagnosis not present

## 2023-06-19 DIAGNOSIS — Z3A01 Less than 8 weeks gestation of pregnancy: Secondary | ICD-10-CM | POA: Diagnosis not present

## 2023-06-19 DIAGNOSIS — O3680X0 Pregnancy with inconclusive fetal viability, not applicable or unspecified: Secondary | ICD-10-CM | POA: Diagnosis not present

## 2023-06-19 DIAGNOSIS — O458X1 Other premature separation of placenta, first trimester: Secondary | ICD-10-CM | POA: Diagnosis not present

## 2023-06-19 DIAGNOSIS — O209 Hemorrhage in early pregnancy, unspecified: Secondary | ICD-10-CM | POA: Diagnosis not present

## 2023-06-20 DIAGNOSIS — O458X1 Other premature separation of placenta, first trimester: Secondary | ICD-10-CM | POA: Diagnosis not present

## 2023-06-20 DIAGNOSIS — Z3A01 Less than 8 weeks gestation of pregnancy: Secondary | ICD-10-CM | POA: Diagnosis not present

## 2023-06-20 DIAGNOSIS — O3680X0 Pregnancy with inconclusive fetal viability, not applicable or unspecified: Secondary | ICD-10-CM | POA: Diagnosis not present

## 2023-06-22 DIAGNOSIS — O99511 Diseases of the respiratory system complicating pregnancy, first trimester: Secondary | ICD-10-CM | POA: Diagnosis not present

## 2023-06-22 DIAGNOSIS — Z20822 Contact with and (suspected) exposure to covid-19: Secondary | ICD-10-CM | POA: Diagnosis not present

## 2023-06-22 DIAGNOSIS — E282 Polycystic ovarian syndrome: Secondary | ICD-10-CM | POA: Diagnosis not present

## 2023-06-22 DIAGNOSIS — Z3A01 Less than 8 weeks gestation of pregnancy: Secondary | ICD-10-CM | POA: Diagnosis not present

## 2023-06-22 DIAGNOSIS — Z5941 Food insecurity: Secondary | ICD-10-CM | POA: Diagnosis not present

## 2023-06-22 DIAGNOSIS — J329 Chronic sinusitis, unspecified: Secondary | ICD-10-CM | POA: Diagnosis not present

## 2023-06-22 DIAGNOSIS — O99281 Endocrine, nutritional and metabolic diseases complicating pregnancy, first trimester: Secondary | ICD-10-CM | POA: Diagnosis not present

## 2023-06-22 DIAGNOSIS — O99211 Obesity complicating pregnancy, first trimester: Secondary | ICD-10-CM | POA: Diagnosis not present

## 2023-06-22 DIAGNOSIS — O219 Vomiting of pregnancy, unspecified: Secondary | ICD-10-CM | POA: Diagnosis not present

## 2023-07-21 DIAGNOSIS — E282 Polycystic ovarian syndrome: Secondary | ICD-10-CM | POA: Diagnosis not present

## 2023-07-21 DIAGNOSIS — O219 Vomiting of pregnancy, unspecified: Secondary | ICD-10-CM | POA: Diagnosis not present

## 2023-07-21 DIAGNOSIS — Z3490 Encounter for supervision of normal pregnancy, unspecified, unspecified trimester: Secondary | ICD-10-CM | POA: Diagnosis not present

## 2023-07-28 DIAGNOSIS — Z3490 Encounter for supervision of normal pregnancy, unspecified, unspecified trimester: Secondary | ICD-10-CM | POA: Diagnosis not present

## 2023-07-28 DIAGNOSIS — Z3401 Encounter for supervision of normal first pregnancy, first trimester: Secondary | ICD-10-CM | POA: Diagnosis not present

## 2023-07-28 DIAGNOSIS — Z114 Encounter for screening for human immunodeficiency virus [HIV]: Secondary | ICD-10-CM | POA: Diagnosis not present

## 2023-07-28 DIAGNOSIS — Z1331 Encounter for screening for depression: Secondary | ICD-10-CM | POA: Diagnosis not present

## 2023-07-28 DIAGNOSIS — O99211 Obesity complicating pregnancy, first trimester: Secondary | ICD-10-CM | POA: Diagnosis not present

## 2023-08-04 DIAGNOSIS — Z118 Encounter for screening for other infectious and parasitic diseases: Secondary | ICD-10-CM | POA: Diagnosis not present

## 2023-08-04 DIAGNOSIS — Z113 Encounter for screening for infections with a predominantly sexual mode of transmission: Secondary | ICD-10-CM | POA: Diagnosis not present

## 2023-08-05 DIAGNOSIS — Z3A13 13 weeks gestation of pregnancy: Secondary | ICD-10-CM | POA: Diagnosis not present

## 2023-08-05 DIAGNOSIS — O99891 Other specified diseases and conditions complicating pregnancy: Secondary | ICD-10-CM | POA: Diagnosis not present

## 2023-08-05 DIAGNOSIS — N390 Urinary tract infection, site not specified: Secondary | ICD-10-CM | POA: Diagnosis not present

## 2023-08-05 DIAGNOSIS — M545 Low back pain, unspecified: Secondary | ICD-10-CM | POA: Diagnosis not present

## 2023-08-09 DIAGNOSIS — O26892 Other specified pregnancy related conditions, second trimester: Secondary | ICD-10-CM | POA: Diagnosis not present

## 2023-08-09 DIAGNOSIS — M545 Low back pain, unspecified: Secondary | ICD-10-CM | POA: Diagnosis not present

## 2023-08-09 DIAGNOSIS — M5459 Other low back pain: Secondary | ICD-10-CM | POA: Diagnosis not present

## 2023-08-10 ENCOUNTER — Ambulatory Visit (INDEPENDENT_AMBULATORY_CARE_PROVIDER_SITE_OTHER): Payer: Medicaid Other | Admitting: Nurse Practitioner

## 2023-08-10 ENCOUNTER — Encounter: Payer: Self-pay | Admitting: Nurse Practitioner

## 2023-08-10 VITALS — BP 122/73 | HR 99 | Temp 97.6°F | Ht 70.0 in | Wt 319.0 lb

## 2023-08-10 DIAGNOSIS — N3000 Acute cystitis without hematuria: Secondary | ICD-10-CM | POA: Diagnosis not present

## 2023-08-10 NOTE — Progress Notes (Signed)
Acute Office Visit  Subjective:     Patient ID: Connie Rivera, female    DOB: Nov 07, 2001, 22 y.o.   MRN: [id number]  Chief Complaint  Patient presents with   Fever    Pt went to obgyn to get checked for uti was given keflex. Pt has had low grade fever. Pt is also [redacted] weeks pregnant.     Fever  Associated symptoms include coughing. Pertinent negatives include no abdominal pain, chest pain, headaches, nausea, rash, urinary pain or vomiting.   Connie Rivera is a 22 yrs old here fro an acute visit for fever. She is [redacted] weeks pregnant and was seen at her Gyn office yesterday and currently taking Keflex BID for UTI. " I woke this morning, I felt warm, check my temp ad it was 99.1. I took a bay Asprin and felt better". At the office Temp is 97.6 and client is asymptomatic. She is being treated empirically while awaiting culture results. She has an appointment with GY tomorrow for [place]. Denies Chill, N/V, Flank pain , urgency, frequency. Active Ambulatory Problems    Diagnosis Date Noted   Migraine without aura and without status migrainosus, not intractable 12/19/2014   Tension headache 12/19/2014   Irregular periods 07/10/2015   Morbid childhood obesity with BMI greater than 99th percentile for age [facility]) 07/10/2015   Amenorrhea 07/10/2015   Rhinitis, allergic 11/12/2015   PTSD (post-traumatic stress disorder) 02/03/2016   Recurrent major depressive disorder (HCC) 03/04/2016   GAD (generalized anxiety disorder) 10/22/2016   Post-traumatic headache, not intractable 11/23/2016   Morbid obesity (HCC) 04/14/2017   Acanthosis nigricans, acquired 04/14/2017   Insulin resistance 04/21/2017   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  Respiratory:  Positive for cough. Negative for shortness of breath.   Cardiovascular:  Negative for chest pain and leg swelling.  Gastrointestinal:  Negative for abdominal pain, nausea and vomiting.  Genitourinary:  Negative for dysuria, flank pain, frequency, hematuria and urgency.  Musculoskeletal:  Negative for back pain and myalgias.  Skin:  Negative for itching and rash.  Neurological:  Negative for dizziness and headaches.  Endo/Heme/Allergies:  Negative for environmental allergies and polydipsia. Does not bruise/bleed easily.   Negative unless indicated in HPI    Objective:    BP 122/73   Pulse 99   Temp 97.6 F (36.4 C) (Temporal)   Ht 5\' 10"  (1.778 m)   Wt (!) 319 lb (144.7 kg)   SpO2 98%   BMI 45.77 kg/m  BP Readings from Last 3 Encounters:  08/10/23 122/73  10/13/22 132/82  06/26/22 109/69   Wt Readings from Last 3 Encounters:  08/10/23 (!) 319 lb (144.7 kg)  10/13/22 (!) 316 lb (143.3 kg)  06/26/22 (!) 327 lb 2 oz (148.4 kg)      Physical Exam Vitals and nursing note reviewed.  Constitutional:      Appearance: Normal appearance.  HENT:     Head: Normocephalic and atraumatic.  Eyes:     General: No scleral icterus.    Extraocular Movements: Extraocular  movements intact.     Conjunctiva/sclera: Conjunctivae normal.     Pupils: Pupils are equal, round, and reactive to light.  Cardiovascular:     Rate and Rhythm: Normal rate and regular rhythm.  Pulmonary:     Effort: Pulmonary effort is normal.     Breath sounds: Normal breath sounds.  Musculoskeletal:     Right lower leg: No edema.     Left lower leg: No edema.  Skin:    General: Skin is warm and dry.     Coloration: Skin is not jaundiced.  Neurological:     Mental Status: She is alert and oriented to person, place, and time. Mental status is at baseline.  Psychiatric:        Mood and Affect: Mood normal.         Behavior: Behavior normal.        Thought Content: Thought content normal.        Judgment: Judgment normal.     No results found for any visits on 08/10/23.      Assessment & Plan:  Acute cystitis without hematuria  Connie Rivera is a 22 yrs old pregnant female. No acute distress Continue Keflex BID as prescribed by GYN Monitor temp at home Increase hydration  The above assessment and management plan was discussed with the patient. The patient verbalized understanding of and has agreed to the management plan. Patient is aware to call the clinic if they develop any new symptoms or if symptoms persist or worsen. Patient is aware when to return to the clinic for a follow-up visit. Patient educated on when it is appropriate to go to the emergency department.   Return if symptoms worsen or fail to improve.  Arrie Aran Santa Lighter, DNP [facility] [address] [place], [place] [address] [phone]

## 2023-08-11 DIAGNOSIS — Z3687 Encounter for antenatal screening for uncertain dates: Secondary | ICD-10-CM | POA: Diagnosis not present

## 2023-09-01 DIAGNOSIS — Z124 Encounter for screening for malignant neoplasm of cervix: Secondary | ICD-10-CM | POA: Diagnosis not present

## 2023-09-01 DIAGNOSIS — Z363 Encounter for antenatal screening for malformations: Secondary | ICD-10-CM | POA: Diagnosis not present

## 2023-09-01 DIAGNOSIS — Z113 Encounter for screening for infections with a predominantly sexual mode of transmission: Secondary | ICD-10-CM | POA: Diagnosis not present

## 2023-09-22 DIAGNOSIS — Z363 Encounter for antenatal screening for malformations: Secondary | ICD-10-CM | POA: Diagnosis not present

## 2023-10-15 ENCOUNTER — Encounter: Payer: Self-pay | Admitting: Family

## 2023-10-15 ENCOUNTER — Telehealth (INDEPENDENT_AMBULATORY_CARE_PROVIDER_SITE_OTHER): Payer: Medicaid Other | Admitting: Family

## 2023-10-15 DIAGNOSIS — J069 Acute upper respiratory infection, unspecified: Secondary | ICD-10-CM

## 2023-10-15 MED ORDER — CETIRIZINE HCL 10 MG PO TABS: 10.0000 mg | ORAL_TABLET | Freq: Every day | ORAL | 1 refills | Status: DC

## 2023-10-15 MED ORDER — FLUTICASONE PROPIONATE 50 MCG/ACT NA SUSP: 2.0000 | Freq: Every day | NASAL | 6 refills | Status: DC

## 2023-10-15 NOTE — Progress Notes (Signed)
Virtual Visit Consent   Connie Rivera, you are scheduled for a virtual visit with a [facility] provider today. Just as with appointments in the office, your consent must be obtained to participate. Your consent will be active for this visit and any virtual visit you may have with one of our providers in the next 365 days. If you have a MyChart account, a copy of this consent can be sent to you electronically.  As this is a virtual visit, video technology does not allow for your provider to perform a traditional examination. This may limit your provider's ability to fully assess your condition. If your provider identifies any concerns that need to be evaluated in person or the need to arrange testing (such as labs, EKG, etc.), we will make arrangements to do so. Although advances in technology are sophisticated, we cannot ensure that it will always work on either your end or our end. If the connection with a video visit is poor, the visit may have to be switched to a telephone visit. With either a video or telephone visit, we are not always able to ensure that we have a secure connection.  By engaging in this virtual visit, you consent to the provision of healthcare and authorize for your insurance to be billed (if applicable) for the services provided during this visit. Depending on your insurance coverage, you may receive a charge related to this service.  I need to obtain your verbal consent now. Are you willing to proceed with your visit today? Connie Rivera has provided verbal consent on 10/15/2023 for a virtual visit (video or telephone). Jannifer Rodney, FNP  Date: 10/15/2023 2:21 PM  Virtual Visit via Video Note   I, Jannifer Rodney, connected with  Connie Rivera  ([id number], 2001/12/06) on 10/15/23 at  5:15 PM EDT by a video-enabled telemedicine application and verified that I am speaking with the correct person using two identifiers.  Location: Patient: Virtual Visit Location Patient:  Home Provider: Virtual Visit Location Provider: Home Office   I discussed the limitations of evaluation and management by telemedicine and the availability of in person appointments. The patient expressed understanding and agreed to proceed.    History of Present Illness: Connie Rivera is a 22 y.o. who identifies as a female who was assigned female at birth, and is being seen today for sinus congestion and sinus pressure that started two days ago. She [redacted] weeks pregnant.   HPI: URI  This is a new problem. The current episode started in the past 7 days. The problem has been gradually worsening. There has been no fever. Associated symptoms include congestion, coughing (small), ear pain, headaches, sinus pain, sneezing and a sore throat. She has tried acetaminophen for the symptoms. The treatment provided no relief.    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/2018   Insulin resistance 04/21/2017   Morbid obesity (HCC) 04/14/2017   Acanthosis nigricans, acquired 04/14/2017   Post-traumatic headache, not intractable 11/23/2016   GAD (generalized anxiety disorder) 10/22/2016   Recurrent major depressive disorder (HCC) 03/04/2016   PTSD (post-traumatic stress disorder) 02/03/2016   Rhinitis, allergic 11/12/2015   Irregular periods 07/10/2015   Severe obesity with body mass index (BMI) greater than 99th percentile for age in childhood [facility]) 07/10/2015   Amenorrhea 07/10/2015   Migraine without aura and without status migrainosus, not intractable 12/19/2014  Tension headache 12/19/2014    Allergies:  Allergies  Allergen Reactions   Hydrocodone Palpitations   Pineapple Anaphylaxis and Itching   Wound Dressing Adhesive Itching and Rash   Adhesive [Tape] Rash   Latex Rash   Medications:  Current Outpatient Medications:    cetirizine (ZYRTEC ALLERGY) 10 MG tablet, Take 1 tablet  (10 mg total) by mouth daily., Disp: 90 tablet, Rfl: 1   fluticasone (FLONASE) 50 MCG/ACT nasal spray, Place 2 sprays into both nostrils daily., Disp: 16 g, Rfl: 6   aspirin 81 MG chewable tablet, Chew by mouth daily., Disp: , Rfl:    Prenatal Vit-Fe Fumarate-FA (PRENATAL MULTIVITAMIN) TABS tablet, Take 1 tablet by mouth daily at 12 noon., Disp: , Rfl:   Observations/Objective: Patient is well-developed, well-nourished in no acute distress.  Resting comfortably  at home.  Head is normocephalic, atraumatic.  No labored breathing.  Speech is clear and coherent with logical content.  Patient is alert and oriented at baseline.  Nasal congestion  Assessment and Plan: 1. Viral URI - cetirizine (ZYRTEC ALLERGY) 10 MG tablet; Take 1 tablet (10 mg total) by mouth daily.  Dispense: 90 tablet; Refill: 1 - fluticasone (FLONASE) 50 MCG/ACT nasal spray; Place 2 sprays into both nostrils daily.  Dispense: 16 g; Refill: 6  - Take meds as prescribed - Use a cool mist humidifier  -Use saline nose sprays frequently -Force fluids -For fever or aces or pains- take tylenol or ibuprofen. -Throat lozenges if help -Follow up if symptoms worsen or do not improve   Follow Up Instructions: I discussed the assessment and treatment plan with the patient. The patient was provided an opportunity to ask questions and all were answered. The patient agreed with the plan and demonstrated an understanding of the instructions.  A copy of instructions were sent to the patient via MyChart unless otherwise noted below.     The patient was advised to call back or seek an in-person evaluation if the symptoms worsen or if the condition fails to improve as anticipated.    Jannifer Rodney, FNP

## 2023-10-16 DIAGNOSIS — R059 Cough, unspecified: Secondary | ICD-10-CM | POA: Diagnosis not present

## 2023-10-16 DIAGNOSIS — O99512 Diseases of the respiratory system complicating pregnancy, second trimester: Secondary | ICD-10-CM | POA: Diagnosis not present

## 2023-10-16 DIAGNOSIS — Z3A25 25 weeks gestation of pregnancy: Secondary | ICD-10-CM | POA: Diagnosis not present

## 2023-10-16 DIAGNOSIS — J069 Acute upper respiratory infection, unspecified: Secondary | ICD-10-CM | POA: Diagnosis not present

## 2023-10-16 DIAGNOSIS — Z20822 Contact with and (suspected) exposure to covid-19: Secondary | ICD-10-CM | POA: Diagnosis not present

## 2023-10-16 DIAGNOSIS — B9789 Other viral agents as the cause of diseases classified elsewhere: Secondary | ICD-10-CM | POA: Diagnosis not present

## 2023-10-16 DIAGNOSIS — Z7722 Contact with and (suspected) exposure to environmental tobacco smoke (acute) (chronic): Secondary | ICD-10-CM | POA: Diagnosis not present

## 2023-10-16 DIAGNOSIS — O98512 Other viral diseases complicating pregnancy, second trimester: Secondary | ICD-10-CM | POA: Diagnosis not present

## 2023-10-16 DIAGNOSIS — Z3A24 24 weeks gestation of pregnancy: Secondary | ICD-10-CM | POA: Diagnosis not present

## 2023-11-17 DIAGNOSIS — Z3483 Encounter for supervision of other normal pregnancy, third trimester: Secondary | ICD-10-CM | POA: Diagnosis not present

## 2023-11-17 DIAGNOSIS — Z3A28 28 weeks gestation of pregnancy: Secondary | ICD-10-CM | POA: Diagnosis not present

## 2023-11-17 DIAGNOSIS — Z3490 Encounter for supervision of normal pregnancy, unspecified, unspecified trimester: Secondary | ICD-10-CM | POA: Diagnosis not present

## 2023-11-17 DIAGNOSIS — Z23 Encounter for immunization: Secondary | ICD-10-CM | POA: Diagnosis not present

## 2023-11-17 DIAGNOSIS — Z3402 Encounter for supervision of normal first pregnancy, second trimester: Secondary | ICD-10-CM | POA: Diagnosis not present

## 2023-12-08 DIAGNOSIS — O26843 Uterine size-date discrepancy, third trimester: Secondary | ICD-10-CM | POA: Diagnosis not present

## 2023-12-08 DIAGNOSIS — O99213 Obesity complicating pregnancy, third trimester: Secondary | ICD-10-CM | POA: Diagnosis not present

## 2023-12-26 DIAGNOSIS — Z3A34 34 weeks gestation of pregnancy: Secondary | ICD-10-CM | POA: Diagnosis not present

## 2023-12-26 DIAGNOSIS — O99893 Other specified diseases and conditions complicating puerperium: Secondary | ICD-10-CM | POA: Diagnosis not present

## 2023-12-26 DIAGNOSIS — J3489 Other specified disorders of nose and nasal sinuses: Secondary | ICD-10-CM | POA: Diagnosis not present

## 2023-12-29 DIAGNOSIS — O99213 Obesity complicating pregnancy, third trimester: Secondary | ICD-10-CM | POA: Diagnosis not present

## 2023-12-29 DIAGNOSIS — O139 Gestational [pregnancy-induced] hypertension without significant proteinuria, unspecified trimester: Secondary | ICD-10-CM | POA: Diagnosis not present

## 2023-12-29 DIAGNOSIS — Z3A34 34 weeks gestation of pregnancy: Secondary | ICD-10-CM | POA: Diagnosis not present

## 2023-12-30 DIAGNOSIS — O139 Gestational [pregnancy-induced] hypertension without significant proteinuria, unspecified trimester: Secondary | ICD-10-CM | POA: Diagnosis not present

## 2024-01-03 DIAGNOSIS — Z3A35 35 weeks gestation of pregnancy: Secondary | ICD-10-CM | POA: Diagnosis not present

## 2024-01-03 DIAGNOSIS — O99213 Obesity complicating pregnancy, third trimester: Secondary | ICD-10-CM | POA: Diagnosis not present

## 2024-01-03 DIAGNOSIS — Z91018 Allergy to other foods: Secondary | ICD-10-CM | POA: Diagnosis not present

## 2024-01-03 DIAGNOSIS — Z3689 Encounter for other specified antenatal screening: Secondary | ICD-10-CM | POA: Diagnosis not present

## 2024-01-03 DIAGNOSIS — Z79899 Other long term (current) drug therapy: Secondary | ICD-10-CM | POA: Diagnosis not present

## 2024-01-03 DIAGNOSIS — Z6841 Body Mass Index (BMI) 40.0 and over, adult: Secondary | ICD-10-CM | POA: Diagnosis not present

## 2024-01-03 DIAGNOSIS — O26893 Other specified pregnancy related conditions, third trimester: Secondary | ICD-10-CM | POA: Diagnosis not present

## 2024-01-03 DIAGNOSIS — M7989 Other specified soft tissue disorders: Secondary | ICD-10-CM | POA: Diagnosis not present

## 2024-01-03 DIAGNOSIS — R519 Headache, unspecified: Secondary | ICD-10-CM | POA: Diagnosis not present

## 2024-01-03 DIAGNOSIS — Z7982 Long term (current) use of aspirin: Secondary | ICD-10-CM | POA: Diagnosis not present

## 2024-01-03 DIAGNOSIS — O133 Gestational [pregnancy-induced] hypertension without significant proteinuria, third trimester: Secondary | ICD-10-CM | POA: Diagnosis not present

## 2024-01-03 DIAGNOSIS — Z91040 Latex allergy status: Secondary | ICD-10-CM | POA: Diagnosis not present

## 2024-01-05 DIAGNOSIS — O139 Gestational [pregnancy-induced] hypertension without significant proteinuria, unspecified trimester: Secondary | ICD-10-CM | POA: Diagnosis not present

## 2024-01-05 DIAGNOSIS — O99213 Obesity complicating pregnancy, third trimester: Secondary | ICD-10-CM | POA: Diagnosis not present

## 2024-01-11 DIAGNOSIS — O99213 Obesity complicating pregnancy, third trimester: Secondary | ICD-10-CM | POA: Diagnosis not present

## 2024-01-11 DIAGNOSIS — Z3A36 36 weeks gestation of pregnancy: Secondary | ICD-10-CM | POA: Diagnosis not present

## 2024-01-11 DIAGNOSIS — O163 Unspecified maternal hypertension, third trimester: Secondary | ICD-10-CM | POA: Diagnosis not present

## 2024-01-11 DIAGNOSIS — O26893 Other specified pregnancy related conditions, third trimester: Secondary | ICD-10-CM | POA: Diagnosis not present

## 2024-01-11 DIAGNOSIS — R109 Unspecified abdominal pain: Secondary | ICD-10-CM | POA: Diagnosis not present

## 2024-01-11 DIAGNOSIS — M419 Scoliosis, unspecified: Secondary | ICD-10-CM | POA: Diagnosis not present

## 2024-01-11 DIAGNOSIS — F419 Anxiety disorder, unspecified: Secondary | ICD-10-CM | POA: Diagnosis not present

## 2024-01-11 DIAGNOSIS — R519 Headache, unspecified: Secondary | ICD-10-CM | POA: Diagnosis not present

## 2024-01-11 DIAGNOSIS — E282 Polycystic ovarian syndrome: Secondary | ICD-10-CM | POA: Diagnosis not present

## 2024-01-11 DIAGNOSIS — R030 Elevated blood-pressure reading, without diagnosis of hypertension: Secondary | ICD-10-CM | POA: Diagnosis not present

## 2024-01-13 DIAGNOSIS — Z3483 Encounter for supervision of other normal pregnancy, third trimester: Secondary | ICD-10-CM | POA: Diagnosis not present

## 2024-01-13 DIAGNOSIS — Z113 Encounter for screening for infections with a predominantly sexual mode of transmission: Secondary | ICD-10-CM | POA: Diagnosis not present

## 2024-01-13 DIAGNOSIS — Z3A36 36 weeks gestation of pregnancy: Secondary | ICD-10-CM | POA: Diagnosis not present

## 2024-01-13 DIAGNOSIS — O113 Pre-existing hypertension with pre-eclampsia, third trimester: Secondary | ICD-10-CM | POA: Diagnosis not present

## 2024-01-13 DIAGNOSIS — Z3685 Encounter for antenatal screening for Streptococcus B: Secondary | ICD-10-CM | POA: Diagnosis not present

## 2024-01-13 DIAGNOSIS — Z118 Encounter for screening for other infectious and parasitic diseases: Secondary | ICD-10-CM | POA: Diagnosis not present

## 2024-01-17 DIAGNOSIS — O99213 Obesity complicating pregnancy, third trimester: Secondary | ICD-10-CM | POA: Diagnosis not present

## 2024-01-17 DIAGNOSIS — Z3A37 37 weeks gestation of pregnancy: Secondary | ICD-10-CM | POA: Diagnosis not present

## 2024-01-20 DIAGNOSIS — O139 Gestational [pregnancy-induced] hypertension without significant proteinuria, unspecified trimester: Secondary | ICD-10-CM | POA: Diagnosis not present

## 2024-01-20 DIAGNOSIS — Z3A37 37 weeks gestation of pregnancy: Secondary | ICD-10-CM | POA: Diagnosis not present

## 2024-01-20 DIAGNOSIS — O163 Unspecified maternal hypertension, third trimester: Secondary | ICD-10-CM | POA: Diagnosis not present

## 2024-01-20 DIAGNOSIS — O99213 Obesity complicating pregnancy, third trimester: Secondary | ICD-10-CM | POA: Diagnosis not present

## 2024-01-21 DIAGNOSIS — O99214 Obesity complicating childbirth: Secondary | ICD-10-CM | POA: Diagnosis not present

## 2024-01-21 DIAGNOSIS — O99334 Smoking (tobacco) complicating childbirth: Secondary | ICD-10-CM | POA: Diagnosis not present

## 2024-01-21 DIAGNOSIS — Z7982 Long term (current) use of aspirin: Secondary | ICD-10-CM | POA: Diagnosis not present

## 2024-01-21 DIAGNOSIS — F17210 Nicotine dependence, cigarettes, uncomplicated: Secondary | ICD-10-CM | POA: Diagnosis not present

## 2024-01-21 DIAGNOSIS — O134 Gestational [pregnancy-induced] hypertension without significant proteinuria, complicating childbirth: Secondary | ICD-10-CM | POA: Diagnosis not present

## 2024-01-21 DIAGNOSIS — Z3A37 37 weeks gestation of pregnancy: Secondary | ICD-10-CM | POA: Diagnosis not present

## 2024-01-21 DIAGNOSIS — Z5309 Procedure and treatment not carried out because of other contraindication: Secondary | ICD-10-CM | POA: Diagnosis not present

## 2024-01-22 DIAGNOSIS — O133 Gestational [pregnancy-induced] hypertension without significant proteinuria, third trimester: Secondary | ICD-10-CM | POA: Diagnosis not present

## 2024-01-22 DIAGNOSIS — Z3A37 37 weeks gestation of pregnancy: Secondary | ICD-10-CM | POA: Diagnosis not present

## 2024-01-22 DIAGNOSIS — O43893 Other placental disorders, third trimester: Secondary | ICD-10-CM | POA: Diagnosis not present

## 2024-03-02 DIAGNOSIS — Z1332 Encounter for screening for maternal depression: Secondary | ICD-10-CM | POA: Diagnosis not present

## 2024-03-14 DIAGNOSIS — Z9189 Other specified personal risk factors, not elsewhere classified: Secondary | ICD-10-CM | POA: Diagnosis not present

## 2024-03-14 DIAGNOSIS — E559 Vitamin D deficiency, unspecified: Secondary | ICD-10-CM | POA: Diagnosis not present

## 2024-03-14 DIAGNOSIS — K760 Fatty (change of) liver, not elsewhere classified: Secondary | ICD-10-CM | POA: Diagnosis not present

## 2024-03-14 DIAGNOSIS — J45909 Unspecified asthma, uncomplicated: Secondary | ICD-10-CM | POA: Diagnosis not present

## 2024-03-14 DIAGNOSIS — Z6841 Body Mass Index (BMI) 40.0 and over, adult: Secondary | ICD-10-CM | POA: Diagnosis not present

## 2024-03-14 DIAGNOSIS — E282 Polycystic ovarian syndrome: Secondary | ICD-10-CM | POA: Diagnosis not present

## 2024-03-14 DIAGNOSIS — E782 Mixed hyperlipidemia: Secondary | ICD-10-CM | POA: Diagnosis not present

## 2024-03-29 DIAGNOSIS — K760 Fatty (change of) liver, not elsewhere classified: Secondary | ICD-10-CM | POA: Diagnosis not present

## 2024-03-29 DIAGNOSIS — E782 Mixed hyperlipidemia: Secondary | ICD-10-CM | POA: Diagnosis not present

## 2024-03-29 DIAGNOSIS — Z9189 Other specified personal risk factors, not elsewhere classified: Secondary | ICD-10-CM | POA: Diagnosis not present

## 2024-03-29 DIAGNOSIS — J45909 Unspecified asthma, uncomplicated: Secondary | ICD-10-CM | POA: Diagnosis not present

## 2024-03-29 DIAGNOSIS — Z6841 Body Mass Index (BMI) 40.0 and over, adult: Secondary | ICD-10-CM | POA: Diagnosis not present

## 2024-03-29 DIAGNOSIS — E559 Vitamin D deficiency, unspecified: Secondary | ICD-10-CM | POA: Diagnosis not present

## 2024-03-29 DIAGNOSIS — E282 Polycystic ovarian syndrome: Secondary | ICD-10-CM | POA: Diagnosis not present

## 2024-04-19 DIAGNOSIS — E782 Mixed hyperlipidemia: Secondary | ICD-10-CM | POA: Diagnosis not present

## 2024-04-19 DIAGNOSIS — Z6841 Body Mass Index (BMI) 40.0 and over, adult: Secondary | ICD-10-CM | POA: Diagnosis not present

## 2024-04-19 DIAGNOSIS — E282 Polycystic ovarian syndrome: Secondary | ICD-10-CM | POA: Diagnosis not present

## 2024-04-19 DIAGNOSIS — K5900 Constipation, unspecified: Secondary | ICD-10-CM | POA: Diagnosis not present

## 2024-04-19 DIAGNOSIS — K760 Fatty (change of) liver, not elsewhere classified: Secondary | ICD-10-CM | POA: Diagnosis not present

## 2024-04-19 DIAGNOSIS — Z9189 Other specified personal risk factors, not elsewhere classified: Secondary | ICD-10-CM | POA: Diagnosis not present

## 2024-04-19 DIAGNOSIS — J45909 Unspecified asthma, uncomplicated: Secondary | ICD-10-CM | POA: Diagnosis not present

## 2024-04-19 DIAGNOSIS — E559 Vitamin D deficiency, unspecified: Secondary | ICD-10-CM | POA: Diagnosis not present

## 2024-04-26 ENCOUNTER — Ambulatory Visit: Payer: Self-pay

## 2024-04-26 DIAGNOSIS — R21 Rash and other nonspecific skin eruption: Secondary | ICD-10-CM | POA: Diagnosis not present

## 2024-04-26 NOTE — Telephone Encounter (Signed)
 Noted and PCP agrees with disposition.

## 2024-04-26 NOTE — Telephone Encounter (Signed)
 Copied from CRM [id number]. Topic: Clinical - Red Word Triage >> Apr 26, 2024  3:29 PM Sasha H wrote: Red Word that prompted transfer to Nurse Triage: pt is having an allergic reaction to something. Has bumps all over her body with bumps also in her mouth and itching.   Chief Complaint: Rash Symptoms: Diffuse rash, hand swelling  Frequency: Constant  Pertinent Negatives: Patient denies tongue swelling, difficulty breathing, difficulty swallowing  Disposition: [] ED /[x] Urgent Care (no appt availability in office) / [] Appointment(In office/virtual)/ []  [facility]/ [] Home Care/ [] Refused Recommended Disposition /[] [facility]/ []  Follow-up with PCP Additional Notes: Patient reports she woke up this morning with itchiness that developed into a diffuse rash. She states that the rash is red and that the spots are dime to quarter sized. She states she is also experiencing some swelling of her hands and states she also has some spots in her mouth. Patient advised there are no appointments available today and to go to urgent care for evaluation and treatment. She verbalized understanding and agreement with this plan.     Reason for Disposition  Bloody crusts on lips or sores in mouth  Answer Assessment - Initial Assessment Questions 1. APPEARANCE of RASH: "Describe the rash." (e.g., spots, blisters, raised areas, skin peeling, scaly)     Bumps all over body  2. SIZE: "How big are the spots?" (e.g., tip of pen, eraser, coin; inches, centimeters)     Size of quarter to dime  3. LOCATION: "Where is the rash located?"     Arms, ears, back of neck, legs, tongue 4. COLOR: "What color is the rash?" (Note: It is difficult to assess rash color in people with darker-colored skin. When this situation occurs, simply ask the caller to describe what they see.)     Red 5. ONSET: "When did the rash begin?"     Woke up itchy this morning  6. FEVER: "Do you have a fever?" If Yes, ask: "What is  your temperature, how was it measured, and when did it start?"     No 7. ITCHING: "Does the rash itch?" If Yes, ask: "How bad is the itch?" (Scale 1-10; or mild, moderate, severe)     Moderate to severe  8. CAUSE: "What do you think is causing the rash?"     Unsure  9. MEDICINE FACTORS: "Have you started any new medicines within the last 2 weeks?" (e.g., antibiotics)      No 10. OTHER SYMPTOMS: "Do you have any other symptoms?" (e.g., dizziness, headache, sore throat, joint pain)       Hand swelling  Protocols used: Rash or Redness - [facility]

## 2024-04-28 ENCOUNTER — Ambulatory Visit (INDEPENDENT_AMBULATORY_CARE_PROVIDER_SITE_OTHER): Admitting: Family Medicine

## 2024-04-28 ENCOUNTER — Encounter: Payer: Self-pay | Admitting: Family Medicine

## 2024-04-28 VITALS — BP 103/67 | HR 72 | Temp 97.9°F | Ht 70.0 in | Wt 367.0 lb

## 2024-04-28 DIAGNOSIS — F3342 Major depressive disorder, recurrent, in full remission: Secondary | ICD-10-CM

## 2024-04-28 DIAGNOSIS — R21 Rash and other nonspecific skin eruption: Secondary | ICD-10-CM | POA: Diagnosis not present

## 2024-04-28 DIAGNOSIS — F411 Generalized anxiety disorder: Secondary | ICD-10-CM

## 2024-04-28 MED ORDER — LEVOCETIRIZINE DIHYDROCHLORIDE 5 MG PO TABS
5.0000 mg | ORAL_TABLET | Freq: Every evening | ORAL | 3 refills | Status: DC
Start: 2024-04-28 — End: 2024-11-01

## 2024-04-28 NOTE — Progress Notes (Unsigned)
   Acute Office Visit  Subjective:     Patient ID: Connie Rivera, female    DOB: 19-May-2001, 23 y.o.   MRN: [id number]  Chief Complaint  Patient presents with   Rash    HPI Patient is in today for a rash. This has been recurrent over the last 2 days. She was seen at [facility] on 04/26/24 for the rash.   Reports broke out in hives 2 days ago after eating red meat the night before.   Also broke back out into a rash all over a day after using voltaren  on here upper back.   Rash is itchy.   ROS      Objective:    BP 103/67   Pulse 72   Temp 97.9 F (36.6 C) (Temporal)   Ht 5\' 10"  (1.778 m)   Wt (!) 367 lb (166.5 kg)   LMP 04/20/2024 (Exact Date)   SpO2 97%   Breastfeeding Unknown   BMI 52.66 kg/m  {Vitals History (Optional):23777}  Physical Exam  No results found for any visits on 04/28/24.      Assessment & Plan:   Problem List Items Addressed This Visit   None   No orders of the defined types were placed in this encounter.   No follow-ups on file.  Albertha Huger, FNP

## 2024-05-01 ENCOUNTER — Encounter: Payer: Self-pay | Admitting: Family Medicine

## 2024-05-01 ENCOUNTER — Telehealth: Payer: Self-pay | Admitting: Family Medicine

## 2024-05-01 ENCOUNTER — Other Ambulatory Visit: Payer: Self-pay | Admitting: Family Medicine

## 2024-05-01 DIAGNOSIS — Z91018 Allergy to other foods: Secondary | ICD-10-CM

## 2024-05-01 LAB — ALPHA-GAL PANEL
Allergen Lamb IgE: 6.62 kU/L — AB
Beef IgE: 9.46 kU/L — AB
IgE (Immunoglobulin E), Serum: 73 [IU]/mL (ref 6–495)
O215-IgE Alpha-Gal: 14.1 kU/L — AB
Pork IgE: 4.46 kU/L — AB

## 2024-05-01 MED ORDER — EPINEPHRINE 0.3 MG/0.3ML IJ SOAJ: 0.3000 mg | INTRAMUSCULAR | 1 refills | Status: DC | PRN

## 2024-05-01 MED ORDER — EPINEPHRINE 0.15 MG/0.3ML IJ SOAJ: 0.1500 mg | INTRAMUSCULAR | 0 refills | Status: DC | PRN

## 2024-05-01 NOTE — Telephone Encounter (Signed)
 Copied from CRM [id number]. Topic: Clinical - Prescription Issue >> May 01, 2024  8:57 AM Blair Bumpers wrote: Reason for CRM: April, pharmacist with [facility], called stating that they received a prescription for EPINEPHrine (EPIPEN JR 2-PAK) 0.15 MG/0.3ML injection. April states patient is an adult, so they are wanting to see if this was an error or if the provider is wanting patient to have the EpiPen JR? Please give her a call back at [phone].

## 2024-05-01 NOTE — Telephone Encounter (Signed)
 New Rx sent in for regular epipen.

## 2024-05-09 ENCOUNTER — Telehealth: Admitting: Nurse Practitioner

## 2024-05-09 ENCOUNTER — Encounter: Payer: Self-pay | Admitting: Nurse Practitioner

## 2024-05-09 NOTE — Progress Notes (Unsigned)
 Past Medical History:  Diagnosis Date   Amenorrhea 07/10/2015   Asthma    Depression    Irregular periods 07/10/2015   Obesity    Scoliosis    Sleep apnea     Past Surgical History:  Procedure Laterality Date   TONSILLECTOMY     TONSILLECTOMY AND ADENOIDECTOMY     TONSILLECTOMY AND ADENOIDECTOMY     TYMPANOSTOMY TUBE PLACEMENT      Social History   Socioeconomic History   Marital status: Married    Spouse name: Not on file   Number of children: Not on file   Years of education: Not on file   Highest education level: Not on file  Occupational History   Not on file  Tobacco Use   Smoking status: Light Smoker    Types: Cigarettes   Smokeless tobacco: Never   Tobacco comments:    Patient is vaping off and on.  Not daily  Vaping Use   Vaping status: Never Used  Substance and Sexual Activity   Alcohol  use: No   Drug use: No   Sexual activity: Yes    Birth control/protection: Condom, Implant  Other Topics Concern   Not on file  Social History Narrative   Halsey is a 10th grade student.   She attends [employer].   She lives with both parents and her cousin.   She enjoys basketball, hanging with friends, and reading.   Social Drivers of Corporate investment banker Strain: Low Risk  (07/21/2023)   Received from [facility]   Overall Financial Resource Strain (CARDIA)    Difficulty of Paying Living Expenses: Not hard at all  Food Insecurity: Low Risk  (01/21/2024)   Received from [facility]   Hunger Vital Sign    Worried About Running Out of Food in the Last Year: Never true    Ran Out of Food in the Last Year: Never true  Transportation Needs: No Transportation Needs (01/21/2024)   Received from [facility]    In the past 12 months, has lack of reliable transportation kept you from medical appointments, meetings, work or from getting things needed for daily living? : No  Physical Activity: Insufficiently Active (10/13/2021)    Received from [facility]   Exercise Vital Sign    Days of Exercise per Week: 2 days    Minutes of Exercise per Session: 30 min  Stress: No Stress Concern Present (05/12/2022)   [employer] of Occupational Health - Occupational Stress Questionnaire    Feeling of Stress : Not at all  Social Connections: Unknown (04/19/2022)   Received from [facility]   Social Network    Social Network: Not on file  Intimate Partner Violence: Not At Risk (10/16/2023)   Received from [facility]   Humiliation, Afraid, Rape, and Kick questionnaire    Fear of Current or Ex-Partner: No    Emotionally Abused: No    Physically Abused: No    Sexually Abused: No    Outpatient Encounter Medications as of 05/09/2024  Medication Sig   EPINEPHrine  0.3 mg/0.3 mL IJ SOAJ injection Inject 0.3 mg into the muscle as needed for anaphylaxis.   fluticasone  (FLONASE ) 50 MCG/ACT nasal spray Place 2 sprays into both nostrils daily.   levocetirizine (XYZAL ) 5 MG tablet Take 1 tablet (5 mg total) by mouth every evening.   metFORMIN (GLUCOPHAGE-XR) 500 MG 24 hr tablet Take 500 mg by mouth 2 (two) times daily  with a meal.   WEGOVY 0.5 MG/0.5ML SOAJ Inject 0.5 mg into the skin once a week.   No facility-administered encounter medications on file as of 05/09/2024.    Allergies  Allergen Reactions   Hydrocodone Palpitations   Pineapple Anaphylaxis and Itching   Wound Dressing Adhesive Itching and Rash   Adhesive [Tape] Rash   Latex Rash    Review of Systems       Observations/Objective: No vital signs or physical exam, this was a virtual health encounter.  Pt alert and oriented, answers all questions appropriately, and able to speak in full sentences.    Assessment and Plan: There are no diagnoses linked to this encounter.   Follow Up Instructions: No follow-ups on file.    I discussed the assessment and treatment plan with the patient. The patient was provided an  opportunity to ask questions and all were answered. The patient agreed with the plan and demonstrated an understanding of the instructions.   The patient was advised to call back or seek an in-person evaluation if the symptoms worsen or if the condition fails to improve as anticipated.  The above assessment and management plan was discussed with the patient. The patient verbalized understanding of and has agreed to the management plan. Patient is aware to call the clinic if they develop any new symptoms or if symptoms persist or worsen. Patient is aware when to return to the clinic for a follow-up visit. Patient educated on when it is appropriate to go to the emergency department.    I provided *** minutes of time during this *** encounter.   Kamareon Sciandra [place] Thompson, DNP [facility] [address] [place], [place] [address] [phone] 05/09/2024

## 2024-05-10 ENCOUNTER — Ambulatory Visit (INDEPENDENT_AMBULATORY_CARE_PROVIDER_SITE_OTHER): Admitting: Family Medicine

## 2024-05-10 VITALS — BP 109/72 | HR 83 | Temp 98.0°F | Ht 70.0 in | Wt 363.4 lb

## 2024-05-10 DIAGNOSIS — E559 Vitamin D deficiency, unspecified: Secondary | ICD-10-CM | POA: Diagnosis not present

## 2024-05-10 DIAGNOSIS — E65 Localized adiposity: Secondary | ICD-10-CM | POA: Diagnosis not present

## 2024-05-10 DIAGNOSIS — J069 Acute upper respiratory infection, unspecified: Secondary | ICD-10-CM | POA: Diagnosis not present

## 2024-05-10 DIAGNOSIS — Z9189 Other specified personal risk factors, not elsewhere classified: Secondary | ICD-10-CM | POA: Diagnosis not present

## 2024-05-10 DIAGNOSIS — E782 Mixed hyperlipidemia: Secondary | ICD-10-CM | POA: Diagnosis not present

## 2024-05-10 DIAGNOSIS — J45909 Unspecified asthma, uncomplicated: Secondary | ICD-10-CM | POA: Diagnosis not present

## 2024-05-10 DIAGNOSIS — J029 Acute pharyngitis, unspecified: Secondary | ICD-10-CM

## 2024-05-10 DIAGNOSIS — Z20818 Contact with and (suspected) exposure to other bacterial communicable diseases: Secondary | ICD-10-CM

## 2024-05-10 DIAGNOSIS — Z6841 Body Mass Index (BMI) 40.0 and over, adult: Secondary | ICD-10-CM | POA: Diagnosis not present

## 2024-05-10 DIAGNOSIS — Z91018 Allergy to other foods: Secondary | ICD-10-CM | POA: Diagnosis not present

## 2024-05-10 DIAGNOSIS — K760 Fatty (change of) liver, not elsewhere classified: Secondary | ICD-10-CM | POA: Diagnosis not present

## 2024-05-10 DIAGNOSIS — K5900 Constipation, unspecified: Secondary | ICD-10-CM | POA: Diagnosis not present

## 2024-05-10 DIAGNOSIS — E282 Polycystic ovarian syndrome: Secondary | ICD-10-CM | POA: Diagnosis not present

## 2024-05-10 LAB — RAPID STREP SCREEN (MED CTR MEBANE ONLY): Strep Gp A Ag, IA W/Reflex: NEGATIVE

## 2024-05-10 LAB — CULTURE, GROUP A STREP

## 2024-05-10 NOTE — Progress Notes (Signed)
 Acute Office Visit  Subjective:     Patient ID: Connie Rivera, female    DOB: Jan 23, 2001, 23 y.o.   MRN: [id number]  Chief Complaint  Patient presents with   Nasal Congestion    Sore Throat  This is a new problem. Episode onset: 4 days. The problem has been gradually worsening. The pain is worse on the right side. There has been no fever. Associated symptoms include congestion and ear pain. Pertinent negatives include no abdominal pain, coughing, diarrhea, drooling, ear discharge, headaches, shortness of breath, trouble swallowing or vomiting. She has had exposure to strep. She has tried acetaminophen  for the symptoms.     Review of Systems  HENT:  Positive for congestion and ear pain. Negative for drooling, ear discharge and trouble swallowing.   Respiratory:  Negative for cough and shortness of breath.   Gastrointestinal:  Negative for abdominal pain, diarrhea and vomiting.  Neurological:  Negative for headaches.        Objective:    BP 109/72   Pulse 83   Temp 98 F (36.7 C) (Temporal)   Ht 5\' 10"  (1.778 m)   Wt (!) 363 lb 6.4 oz (164.8 kg)   LMP 04/20/2024 (Exact Date)   SpO2 95%   BMI 52.14 kg/m    Physical Exam Vitals and nursing note reviewed.  Constitutional:      General: She is not in acute distress.    Appearance: She is obese. She is not ill-appearing, toxic-appearing or diaphoretic.  HENT:     Right Ear: Tympanic membrane, ear canal and external ear normal.     Left Ear: Tympanic membrane, ear canal and external ear normal.     Nose: Congestion present.     Mouth/Throat:     Pharynx: Posterior oropharyngeal erythema present. No pharyngeal swelling, oropharyngeal exudate, uvula swelling or postnasal drip.     Tonsils: No tonsillar exudate or tonsillar abscesses. 1+ on the right. 1+ on the left.  Eyes:     General:        Right eye: No discharge.        Left eye: No discharge.     Conjunctiva/sclera: Conjunctivae normal.  Cardiovascular:     Rate  and Rhythm: Normal rate and regular rhythm.     Heart sounds: Normal heart sounds. No murmur heard. Pulmonary:     Effort: Pulmonary effort is normal. No respiratory distress.     Breath sounds: Normal breath sounds. No wheezing, rhonchi or rales.  Chest:     Chest wall: No tenderness.  Musculoskeletal:     Cervical back: Neck supple. No rigidity.     Right lower leg: No edema.     Left lower leg: No edema.  Lymphadenopathy:     Cervical: No cervical adenopathy.  Skin:    General: Skin is warm and dry.  Neurological:     General: No focal deficit present.     Mental Status: She is alert and oriented to person, place, and time.  Psychiatric:        Mood and Affect: Mood normal.        Behavior: Behavior normal.     No results found for any visits on 05/10/24.      Assessment & Plan:   Connie Rivera was seen today for nasal congestion.  Diagnoses and all orders for this visit:  Sore throat Negative rapid strep. Discussed viral etiology as she also has cough and congestion.  -     Rapid  Strep Screen ([facility] ONLY)  Exposure to strep throat -     Rapid Strep Screen ([facility] ONLY)  Viral URI Discussed symptomatic care and return precautions.    Return if symptoms worsen or fail to improve.  The patient indicates understanding of these issues and agrees with the plan.  Albertha Huger, FNP

## 2024-06-02 ENCOUNTER — Ambulatory Visit (INDEPENDENT_AMBULATORY_CARE_PROVIDER_SITE_OTHER): Admitting: Allergy & Immunology

## 2024-06-02 ENCOUNTER — Encounter: Payer: Self-pay | Admitting: Allergy & Immunology

## 2024-06-02 VITALS — BP 110/70 | HR 102 | Temp 97.7°F | Resp 14 | Ht 69.49 in | Wt 361.4 lb

## 2024-06-02 DIAGNOSIS — T7807XD Anaphylactic reaction due to milk and dairy products, subsequent encounter: Secondary | ICD-10-CM | POA: Diagnosis not present

## 2024-06-02 DIAGNOSIS — L858 Other specified epidermal thickening: Secondary | ICD-10-CM

## 2024-06-02 DIAGNOSIS — Z91014 Allergy to mammalian meats: Secondary | ICD-10-CM

## 2024-06-02 DIAGNOSIS — J310 Chronic rhinitis: Secondary | ICD-10-CM | POA: Diagnosis not present

## 2024-06-02 MED ORDER — AMLACTIN DAILY 12 % EX LOTN: 1.0000 | TOPICAL_LOTION | CUTANEOUS | 5 refills | Status: AC | PRN

## 2024-06-02 MED ORDER — EPINEPHRINE 0.3 MG/0.3ML IJ SOAJ: 0.3000 mg | INTRAMUSCULAR | 1 refills | Status: DC | PRN

## 2024-06-02 NOTE — Patient Instructions (Addendum)
 1. Alpha-gal syndrome  - Testing is still very positive, so I think that the test results are not in question. - We will plan to test every 6-12 months or so. - Emergency action plan provided. - We will send in an EpiPen .   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 - I think we might be able to re-introduce this back into your diet.   3. Keratosis pilaris - This is a benign condition. - Start Amlactin daily to help clear up the skin pores. - Information on KP provided.   4. Chronic rhinitis - Because of insurance stipulations, we cannot do skin testing on the same day as your first visit. - We are all working to fight this, but for now we need to do two separate visits.  - We will know more after we do testing at the next visit.  - The skin testing visit can be squeezed in at your convenience.  - Then we can make a more full plan to address all of your symptoms. - Be sure to stop your antihistamines for 3 days before this appointment.  - I don't think we will need routine daily medications, but we will see what the testing shows.   5. Return in about 1 week (around 06/09/2024) for SKIN TESTING (1-55 + milk + casein). You can have the follow up appointment with Dr. Idolina Maker or a Nurse Practicioner (our Nurse Practitioners are excellent and always have Physician oversight!).    Please inform us  of any Emergency Department visits, hospitalizations, or changes in symptoms. Call us  before going to the ED for breathing or allergy symptoms since we might be able to fit you in for a sick visit. Feel free to contact us  anytime with any questions, problems, or concerns.  It was a pleasure to meet you today!  Websites that have reliable patient information: 1. American Academy of Asthma, Allergy, and Immunology: www.aaaai.org 2. Food Allergy Research and Education (FARE): foodallergy.org 3. Mothers of Asthmatics:  http://www.asthmacommunitynetwork.org 4. American College of Allergy, Asthma, and Immunology: www.acaai.org      "Like" us  on Facebook and Instagram for our latest updates!      A healthy democracy works best when [employer] participate! Make sure you are registered to vote! If you have moved or changed any of your contact information, you will need to get this updated before voting! Scan the QR codes below to learn more!       What is keratosis pilaris? Keratosis pilaris is a common skin condition, which appears as tiny bumps on the skin. Some people say these bumps look like goosebumps or the skin of a plucked chicken. Others mistake the bumps for small pimples. These rough-feeling bumps are actually plugs of dead skin cells. The plugs appear most often on the upper arms and thighs (front). Children may have these bumps on their cheeks. Because keratosis pilaris is harmless, you don't need to treat it. If the itch, dryness, or the appearance of these bumps bothers you, treatment can help. Treatment can ease the symptoms and help you see clearer skin. Treating dry skin often helps. Dry skin can make these bumps more noticeable. In fact, many people say the bumps clear during the summer only to return in the winter. If you decide not to treat these bumps and live in a dry climate or frequently swim in a pool, you may see these bumps year-round.  Who gets keratosis pilaris? People of all ages and races have this common skin condition. For most people, it begins at one of the following times: Before 24 years of age During the teenage years Because keratosis pilaris usually begins early in life, children and teenagers are most likely to have this skin condition. Fewer adults have it because keratosis pilaris can fade and gradually disappear. The bumps may clear by the time a child reaches late childhood or adolescence. Hormones, however, may cause another flare-up around puberty. When  keratosis pilaris develops in the teenage years, it often clears by one's mid-20s. Keratosis pilaris can also continue into one's adult years. Women are a bit more likely to have keratosis pilaris.  What increases a person's risk of getting keratosis pilaris? You are more likely to develop it if you have one or more of the following: Close blood relatives who have keratosis pilaris Asthma Dry skin Eczema (atopic dermatitis) Excess body weight, which makes you overweight or obese Hay fever Ichthyosis vulgaris (a skin condition that causes very dry skin)  What causes keratosis pilaris? Keratosis pilaris is not contagious. We get keratosis pilaris when dead skin cells clog our pores. A pore is also called a hair follicle. Every hair on our body grows out of a hair follicle, so we have thousands of hair follicles. When dead skin cells clog many hair follicles, you feel the rough, dry patches of keratosis pilaris.  How do we treat keratosis pilaris? This skin condition is harmless, so you don't need to treat it. If the itch, dryness, or the appearance of your skin bothers you, treatment can help. A doctor can create a treatment plan that addresses your concerns.   Relieve the itch and dryness: A creamy moisturizer can soothe the itch and dryness.   For best results, apply your moisturizer: After every shower or bath Within 5 minutes of getting out of the bath or shower, while your skin is still damp At least 2 or 3 times a day, gently massaging it into the skin with keratosis pilaris  Diminish the bumpy appearance: To diminish the bumps and improve your skin's texture, doctors often recommend exfoliating (removing dead skin cells from the surface of your skin). Your doctor may recommend that you gently remove dead skin with a loofah or at-home microdermabrasion kit. Your doctor may also prescribe a medicine that will remove dead skin cells. Medicine that can help often contains one of the  following ingredients: Alpha hydroxyl acid Glycolic acid Lactic acid Urea  What is the outcome for people with keratosis pilaris? For many people, keratosis pilaris goes away with time, even if you opt not to treat it. Clearing tends to happen gradually over many years. There is no way to know who will see keratosis pilaris clear.  Treating keratosis pilaris at home  Some people see clearer skin by treating their keratosis pilaris at home. Because you cannot cure keratosis pilaris, you'll need to follow a maintenance plan. This often involves treating your skin a few times a week.  Exfoliate gently. When you exfoliate your skin, you remove the dead skin cells from the surface. You can slough off these dead cells gently with a loofah, buff puff, or rough washcloth. Avoid scrubbing your skin, which tends to irritate the skin and worsen keratosis pilaris. Apply a product called a keratolytic. After exfoliating, apply this skin care product. It, too, helps remove the excessive buildup of dead skin cells. Another name for this product is chemical  exfoliator. Take care to use a keratolytic exactly as described in the directions. Applying too much or using it more often than indicated can cause raw, irritated skin. Slather on moisturizer. Using a keratolyic dries the skin, so you'll want to apply a moisturizer afterwards. Dermatologists recommend using an oil-free cream or ointment to help prevent clogged pores.    You'll also need to take some precautions to prevent flare-ups. The following tips can help.  Tips to prevent flare-ups Moisturize your skin: Keratosis pilaris often flares when the skin becomes dry. Applying a moisturizer can prevent dry skin.  For best results when using a moisturizer: Select a thick oil-free cream or ointment rather than a lotion Use a moisturizer that contains urea or lactic acid Apply it to damp skin within 5 minutes of bathing Slather it on when your skin feels  dry  Take short showers and baths: To prevent drying your skin, take a short (20 minutes or less) bath or shower and use warm rather than hot water. Also, limit bathing to once a day.   Use a mild cleanser: Bar soap can dry your skin.

## 2024-06-02 NOTE — Progress Notes (Signed)
 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 Alpha-gal syndrome  - Testing is still very positive, so I think that the test results are not in question. - We will plan to test every 6-12 months or so. - Emergency action plan provided. - We will send in an EpiPen .   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 - I think we might be able to re-introduce this back into your diet.   3. Keratosis pilaris - This is a benign condition. - Start Amlactin daily to help clear up the skin pores. - Information on KP provided.   4. Chronic rhinitis - Because of insurance stipulations, we cannot do skin testing on the same day as your first visit. - We are all working to fight this, but for now we need to do two separate visits.  - We will know more after we do testing at the next visit.  - The skin testing visit can be squeezed in at your convenience.  - Then we can make a more full plan to address all of your symptoms. - Be sure to stop your antihistamines for 3 days before this appointment.  - I don't think we will need routine daily medications, but we will see what the testing shows.   5. Return in about 1 week (around 06/09/2024) for SKIN TESTING (1-55 + milk + casein). You can have the follow up appointment with Dr. Idolina Maker or a Nurse Practicioner (our Nurse Practitioners are excellent and always have Physician oversight!).   This note in its entirety was forwarded to the Provider who requested this consultation.  Subjective:   Connie Rivera is a 23 y.o. female presenting today for evaluation of  Chief Complaint  Patient presents with    Allergies    Milk and Alpha Gal    [name] has a history of the following: Patient Active Problem List   Diagnosis Date Noted   Hepatic steatosis 02/12/2022   Liver lesion 02/12/2022   Dorsal cervical fat pad 02/24/2018   Insulin  resistance 04/21/2017   Morbid obesity (HCC) 04/14/2017   Acanthosis nigricans, acquired 04/14/2017   Post-traumatic headache, not intractable 11/23/2016   GAD (generalized anxiety disorder) 10/22/2016   Recurrent major depressive disorder (HCC) 03/04/2016   PTSD (post-traumatic stress disorder) 02/03/2016   Rhinitis, allergic 11/12/2015   Irregular periods 07/10/2015   Severe obesity with body mass index (BMI) greater than 99th percentile for age in childhood (HCC) 07/10/2015   Amenorrhea 07/10/2015   Migraine without aura and without status migrainosus, not intractable 12/19/2014   Tension headache 12/19/2014    History obtained from: chart review and patient.  Discussed the use of AI scribe software for clinical note transcription with the patient and/or guardian, who gave verbal consent to proceed.  Connie Rivera was referred by Albertha Huger, FNP.     Connie Rivera is a 23 y.o. female presenting for an evaluation of alpha gal and other allergies.  Allergic Rhinitis Symptom History: She sometimes experiences rhinorrhea and itchy, watery eyes but is unsure about specific environmental allergies. No frequent sinus infections.  Food Allergy Symptom  History: She was diagnosed with alpha-gal syndrome approximately one month ago, following a tick bite about two months ago. She experiences variable allergic reactions, including an episode of waking up in the middle of the night with generalized pruritus after consuming meat the previous evening. Diphenhydramine  was effective in preventing further spread of the rash.  She experienced a significant allergic reaction after consuming a strawberry and cream [facility] bar, [place] onion sun chips, and a  protein shake, resulting in a rash on her foot that persisted for three weeks. She is unsure which specific item caused the reaction. She has not had to use an EpiPen .   She has a total IgE of milk to 1.19 with negative components. She was never having issues with milk itself. She has a history of consuming milk without issues during her pregnancy, but recent tests suggested a possible milk allergy. Despite the positive test, she has not experienced symptoms when consuming milk. She has been avoiding milk since receiving these results.    Skin Symptom History: She reports keratosis pilaris that occasionally itches.  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 She has a four-month-old child and receives childcare support from her parents. Her father also has alpha-gal syndrome.   Otherwise, there is no history of other atopic diseases, including drug allergies, stinging insect allergies, or contact dermatitis. There is no significant infectious history. Vaccinations are up to date.    Past Medical History: Patient Active Problem List   Diagnosis Date Noted   Hepatic steatosis 02/12/2022   Liver lesion 02/12/2022   Dorsal cervical fat pad 02/24/2018   Insulin  resistance 04/21/2017   Morbid obesity (HCC) 04/14/2017   Acanthosis nigricans, acquired 04/14/2017   Post-traumatic headache, not intractable 11/23/2016   GAD (generalized anxiety disorder) 10/22/2016   Recurrent major depressive disorder (HCC) 03/04/2016   PTSD (post-traumatic stress disorder) 02/03/2016   Rhinitis, allergic 11/12/2015   Irregular periods 07/10/2015   Severe obesity with body mass index (BMI) greater than 99th percentile for age in childhood (HCC) 07/10/2015   Amenorrhea 07/10/2015   Migraine without aura and without status migrainosus, not intractable 12/19/2014   Tension headache 12/19/2014    Medication List:  Allergies as of 06/02/2024       Reactions   Hydrocodone  Palpitations   Pineapple Anaphylaxis, Itching   Wound Dressing Adhesive Itching, Rash   Adhesive [tape] Rash   Alpha-gal Hives, Itching, Nausea And Vomiting, Rash, Swelling   Latex Rash        Medication List        Accurate as of June 02, 2024 12:13 PM. If you have any questions, ask your nurse or doctor.          Amlactin Daily 12 % lotion Generic drug: ammonium lactate Apply 1 Application topically as needed (keratosis pilaris). Started by: Rochester Chuck   EPINEPHrine  0.3 mg/0.3 mL Soaj injection Commonly known as: EPI-PEN Inject 0.3 mg into the muscle as needed for anaphylaxis.   fluticasone  50 MCG/ACT nasal spray Commonly known as: FLONASE  Place 2 sprays into both nostrils daily.   levocetirizine 5 MG tablet Commonly known as: XYZAL  Take 1 tablet (5 mg total) by mouth every evening.   metFORMIN 500 MG 24 hr tablet Commonly known as: GLUCOPHAGE-XR Take 500 mg by mouth 2 (two) times daily with a meal.   Wegovy 0.5 MG/0.5ML Soaj Generic drug: Semaglutide-Weight Management Inject 0.5 mg into the skin once a week.  Birth History: non-contributory  Developmental History: non-contributory  Past Surgical History: Past Surgical History:  Procedure Laterality Date   TONSILLECTOMY     TONSILLECTOMY AND ADENOIDECTOMY     TONSILLECTOMY AND ADENOIDECTOMY     TYMPANOSTOMY TUBE PLACEMENT       Family History: Family History  Problem Relation Age of Onset   Migraines Mother    Asthma Father    Hyperlipidemia Father    Hypertension Father    Food Allergy Father    Febrile seizures Sister        Resolved   ADD / ADHD Sister    Anxiety disorder Sister    Bipolar disorder Sister    Migraines Maternal Grandmother    Diabetes Maternal Grandfather    Kidney disease Maternal Grandfather    Hypertension Maternal Grandfather    Hyperlipidemia Maternal Grandfather    Heart murmur Maternal Grandfather    Rheumatic fever Maternal Grandfather     Cancer Paternal Grandmother        breast   COPD Paternal Grandmother    Emphysema Paternal Grandmother    Congestive Heart Failure Paternal Grandmother    Cancer Paternal Grandfather    Asthma Other    Lactose intolerance Niece    Heart attack Neg Hx    Sudden death Neg Hx      Social History: Naela lives at home with her family. She lives in a mobile home that is 24 years old. She has electric heating and central cooling. There are dogs, snakes, and spiders in the home. There are no dust mite coverings on the bedding. There is vape exposure in the car, but not the home. She works for an [employer] with a work from home job. 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 There is a HEPA filter in the home. She is not exposed to fumes, chemicals, or dust. She does live near an industrial area.    Review of systems otherwise negative other than that mentioned in the HPI.    Objective:   Blood pressure 110/70, pulse (!) 102, temperature 97.7 F (36.5 C), resp. rate 14, height 5' 9.49 (1.765 m), weight (!) 361 lb 6 oz (163.9 kg), SpO2 98%, unknown if currently breastfeeding. Body mass index is 52.62 kg/m.     Physical Exam Vitals reviewed.  Constitutional:      Appearance: She is well-developed and overweight. She is not ill-appearing, toxic-appearing or diaphoretic.     Comments: Talkative.  Bubbly.  HENT:     Head: Normocephalic and atraumatic.     Right Ear: Tympanic membrane, ear canal and external ear normal. No drainage, swelling or tenderness. Tympanic membrane is not injected, scarred, erythematous, retracted or bulging.     Left Ear: Tympanic membrane, ear canal and external ear normal. No drainage, swelling or tenderness. Tympanic membrane is not injected, scarred, erythematous, retracted or bulging.     Nose: No nasal deformity, septal deviation, mucosal edema or rhinorrhea.     Right Turbinates: Enlarged, swollen and pale.     Left Turbinates:  Enlarged, swollen and pale.     Right Sinus: No maxillary sinus tenderness or frontal sinus tenderness.     Left Sinus: No maxillary sinus tenderness or frontal sinus tenderness.     Mouth/Throat:     Mouth: Mucous membranes are not pale and not dry.     Pharynx: Uvula midline.   Eyes:     General: Allergic shiner present.  Right eye: No discharge.        Left eye: No discharge.     Conjunctiva/sclera: Conjunctivae normal.     Right eye: Right conjunctiva is not injected. No chemosis.    Left eye: Left conjunctiva is not injected. No chemosis.    Pupils: Pupils are equal, round, and reactive to light.    Cardiovascular:     Rate and Rhythm: Normal rate and regular rhythm.     Heart sounds: Normal heart sounds.  Pulmonary:     Effort: Pulmonary effort is normal. No tachypnea, accessory muscle usage or respiratory distress.     Breath sounds: Normal breath sounds. No wheezing, rhonchi or rales.     Comments: No melena lung fields.  No increased work of breathing. Chest:     Chest wall: No tenderness.  Abdominal:     Tenderness: There is no abdominal tenderness. There is no guarding or rebound.  Lymphadenopathy:     Head:     Right side of head: No submandibular, tonsillar or occipital adenopathy.     Left side of head: No submandibular, tonsillar or occipital adenopathy.     Cervical: No cervical adenopathy.   Skin:    Coloration: Skin is not pale.     Findings: No abrasion, erythema, petechiae or rash. Rash is not papular, urticarial or vesicular.     Comments: No hives.   Neurological:     Mental Status: She is alert.      Diagnostic studies: deferred due to insurance stipulations that require a separate visit for testing          Drexel Gentles, MD [facility] 

## 2024-06-07 DIAGNOSIS — K5900 Constipation, unspecified: Secondary | ICD-10-CM | POA: Diagnosis not present

## 2024-06-07 DIAGNOSIS — E559 Vitamin D deficiency, unspecified: Secondary | ICD-10-CM | POA: Diagnosis not present

## 2024-06-07 DIAGNOSIS — Z6841 Body Mass Index (BMI) 40.0 and over, adult: Secondary | ICD-10-CM | POA: Diagnosis not present

## 2024-06-07 DIAGNOSIS — E782 Mixed hyperlipidemia: Secondary | ICD-10-CM | POA: Diagnosis not present

## 2024-06-07 DIAGNOSIS — E282 Polycystic ovarian syndrome: Secondary | ICD-10-CM | POA: Diagnosis not present

## 2024-06-07 DIAGNOSIS — J45909 Unspecified asthma, uncomplicated: Secondary | ICD-10-CM | POA: Diagnosis not present

## 2024-06-07 DIAGNOSIS — Z9189 Other specified personal risk factors, not elsewhere classified: Secondary | ICD-10-CM | POA: Diagnosis not present

## 2024-06-07 DIAGNOSIS — Z91018 Allergy to other foods: Secondary | ICD-10-CM | POA: Diagnosis not present

## 2024-06-07 DIAGNOSIS — E65 Localized adiposity: Secondary | ICD-10-CM | POA: Diagnosis not present

## 2024-06-07 DIAGNOSIS — K760 Fatty (change of) liver, not elsewhere classified: Secondary | ICD-10-CM | POA: Diagnosis not present

## 2024-06-16 ENCOUNTER — Encounter: Payer: Self-pay | Admitting: Allergy & Immunology

## 2024-06-16 ENCOUNTER — Ambulatory Visit (INDEPENDENT_AMBULATORY_CARE_PROVIDER_SITE_OTHER): Admitting: Allergy & Immunology

## 2024-06-16 DIAGNOSIS — J302 Other seasonal allergic rhinitis: Secondary | ICD-10-CM | POA: Diagnosis not present

## 2024-06-16 DIAGNOSIS — T7807XD Anaphylactic reaction due to milk and dairy products, subsequent encounter: Secondary | ICD-10-CM | POA: Diagnosis not present

## 2024-06-16 DIAGNOSIS — J3089 Other allergic rhinitis: Secondary | ICD-10-CM

## 2024-06-16 MED ORDER — MONTELUKAST SODIUM 10 MG PO TABS: 10.0000 mg | ORAL_TABLET | Freq: Every day | ORAL | 1 refills | Status: DC

## 2024-06-16 MED ORDER — FLUTICASONE PROPIONATE 50 MCG/ACT NA SUSP: 2.0000 | Freq: Every day | NASAL | 1 refills | Status: DC

## 2024-06-16 NOTE — Patient Instructions (Addendum)
 1. Alpha-gal syndrome  - Continue to avoid mammalian meats.  - We will plan to test every 6-12 months or so. - Emergency action plan provided. - EpiPen  is up date.   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 - Testing was negative to pineapple. - We we are getting blood work to this and other requested foods.    3. Keratosis pilaris - Continue Amlactin daily to help clear up the skin pores. - Information on KP provided at the last visit.   4. Chronic rhinitis - Testing today showed: indoor molds, outdoor molds, dust mites, and cat - Copy of test results provided.  - Avoidance measures provided. - Continue with: Xyzal  (levocetirizine) 5mg  tablet once daily and Flonase  (fluticasone ) one spray per nostril daily (AIM FOR EAR ON EACH SIDE) - Start taking: Singulair  (montelukast ) 10mg  daily - Singulair  can cause irritability and bad dreams, so beware. - You can use an extra dose of the antihistamine, if needed, for breakthrough symptoms.  - Consider nasal saline rinses 1-2 times daily to remove allergens from the nasal cavities as well as help with mucous clearance (this is especially helpful to do before the nasal sprays are given) - Strongly allergy shots as a means of long-term control (especially since you have a dog). - Allergy shots re-train and reset the immune system to ignore environmental allergens and decrease the resulting immune response to those allergens (sneezing, itchy watery eyes, runny nose, nasal congestion, etc).    - Allergy shots improve symptoms in 75-85% of patients.   5. Return in about 2 months (around 08/16/2024). You can have the follow up appointment with Dr. Iva or a Nurse Practicioner (our Nurse Practitioners are excellent and always have Physician oversight!).    Please inform us  of any Emergency Department visits, hospitalizations, or changes in symptoms. Call us  before going to the ED for breathing or allergy  symptoms since we might be able to fit you in for a sick visit. Feel free to contact us  anytime with any questions, problems, or concerns.  It was a pleasure to see you again today!  Websites that have reliable patient information: 1. American Academy of Asthma, Allergy, and Immunology: www.aaaai.org 2. Food Allergy Research and Education (FARE): foodallergy.org 3. Mothers of Asthmatics: http://www.asthmacommunitynetwork.org 4. American College of Allergy, Asthma, and Immunology: www.acaai.org      "Like" us  on Facebook and Instagram for our latest updates!      A healthy democracy works best when [employer] participate! Make sure you are registered to vote! If you have moved or changed any of your contact information, you will need to get this updated before voting! Scan the QR codes below to learn more!        Airborne Adult Perc - 06/16/24 0836     Time Antigen Placed 0836    Allergen Manufacturer Jestine    Location Back    Number of Test 55    1. Control-Buffer 50% Glycerol Negative    2. Control-Histamine 3+    3. Bahia Negative    4. French [place] Territ[place] Negative    5. Johnson Negative    6. [place]  Blue Negative    7. Meadow Fescue Negative    8. Perennial Rye Negative    9. Timothy Negative    10. Ragweed Mix Negative    11. Cocklebur Negative    12. Plantain,  English Negative    13. Baccharis Negative    14. Dog Fennel  Negative    15. Russian Thistle Negative    16. Lamb's Quarters Negative    17. Sheep Sorrell Negative    18. Rough Pigweed Negative    19. Marsh Elder, Rough Negative    20. Mugwort, Common Negative    21. Box, Elder Negative    22. Cedar, red Negative    23. Sweet Gum Negative    24. Pecan Pollen Negative    25. Pine Mix Negative    26. Walnut, Black Pollen Negative    27. Red Mulberry Negative    28. Ash Mix Negative    29. Birch Mix Negative    30. Beech American Negative    31. Cottonwood, Guinea-Bissau Negative    32. Hickory, White Negative     33. Maple Mix Negative    34. Oak, Guinea-[place] Mix Negative    35. Sycamore Eastern Negative    36. Alternaria Alternata Negative    37. Cladosporium Herbarum Negative    38. Aspergillus Mix Negative    39. Penicillium Mix Negative    40. Bipolaris Sorokiniana (Helminthosporium) Negative    41. Drechslera Spicifera (Curvularia) Negative    42. Mucor Plumbeus Negative    43. Fusarium Moniliforme Negative    44. Aureobasidium Pullulans (pullulara) Negative    45. Rhizopus Oryzae Negative    46. Botrytis Cinera Negative    47. Epicoccum Nigrum Negative    48. Phoma Betae Negative    49. Dust Mite Mix Negative    50. Cat Hair 10,000 BAU/ml Negative    51.  Dog Epithelia Negative    52. Mixed Feathers Negative    53. Horse Epithelia Negative    54. Cockroach, German Negative    55. Tobacco Leaf Negative          Intradermal - 06/16/24 0918     Time Antigen Placed 9074    Allergen Manufacturer Jestine    Location Arm    Number of Test 16    Control Negative    Bahia Negative    French Southern Territories Negative    Johnson Negative    7 Grass Negative    Ragweed Mix Negative    Weed Mix Negative    Tree Mix Negative    Mold 1 3+    Mold 2 3+    Mold 3 3+    Mold 4 3+    Mite Mix 3+    Cat Negative    Dog 3+    Cockroach Negative          Food Adult Perc - 06/16/24 0800     Time Antigen Placed 9163    Allergen Manufacturer Jestine    Location Back     Control-buffer 50% Glycerol Negative    Control-Histamine 3+    5. Milk, Cow Negative    6. Casein Negative         Control of Mold Allergen   Mold and fungi can grow on a variety of surfaces provided certain temperature and moisture conditions exist.  Outdoor molds grow on plants, decaying vegetation and soil.  The major outdoor mold, Alternaria and Cladosporium, are found in very high numbers during hot and dry conditions.  Generally, a late Summer - Fall peak is seen for common outdoor fungal spores.  Rain will temporarily lower  outdoor mold spore count, but counts rise rapidly when the rainy period ends.  The most important indoor molds are Aspergillus and Penicillium.  Dark, humid and poorly ventilated basements are ideal  sites for mold growth.  The next most common sites of mold growth are the bathroom and the kitchen.  Outdoor (Seasonal) Mold Control  Positive outdoor molds via skin testing: Alternaria, Cladosporium, Bipolaris (Helminthsporium), Drechslera (Curvalaria), and Mucor  Use air conditioning and keep windows closed Avoid exposure to decaying vegetation. Avoid leaf raking. Avoid grain handling. Consider wearing a face mask if working in moldy areas.    Indoor (Perennial) Mold Control   Positive indoor molds via skin testing: Aspergillus, Penicillium, Fusarium, Aureobasidium (Pullulara), and Rhizopus  Maintain humidity below 50%. Clean washable surfaces with 5% bleach solution. Remove sources e.g. contaminated carpets.    Control of Dog or Cat Allergen  Avoidance is the best way to manage a dog or cat allergy. If you have a dog or cat and are allergic to dog or cats, consider removing the dog or cat from the home. If you have a dog or cat but don't want to find it a new home, or if your family wants a pet even though someone in the household is allergic, here are some strategies that may help keep symptoms at bay:  Keep the pet out of your bedroom and restrict it to only a few rooms. Be advised that keeping the dog or cat in only one room will not limit the allergens to that room. Don't pet, hug or kiss the dog or cat; if you do, wash your hands with soap and water. High-efficiency particulate air (HEPA) cleaners run continuously in a bedroom or living room can reduce allergen levels over time. Regular use of a high-efficiency vacuum cleaner or a central vacuum can reduce allergen levels. Giving your dog or cat a bath at least once a week can reduce airborne allergen.  Control of Dust Mite  Allergen    Dust mites play a major role in allergic asthma and rhinitis.  They occur in environments with high humidity wherever human skin is found.  Dust mites absorb humidity from the atmosphere (ie, they do not drink) and feed on organic matter (including shed human and animal skin).  Dust mites are a microscopic type of insect that you cannot see with the naked eye.  High levels of dust mites have been detected from mattresses, pillows, carpets, upholstered furniture, bed covers, clothes, soft toys and any woven material.  The principal allergen of the dust mite is found in its feces.  A gram of dust may contain 1,000 mites and 250,000 fecal particles.  Mite antigen is easily measured in the air during house cleaning activities.  Dust mites do not bite and do not cause harm to humans, other than by triggering allergies/asthma.    Ways to decrease your exposure to dust mites in your home:  Encase mattresses, box springs and pillows with a mite-impermeable barrier or cover   Wash sheets, blankets and drapes weekly in hot water (130 F) with detergent and dry them in a dryer on the hot setting.  Have the room cleaned frequently with a vacuum cleaner and a damp dust-mop.  For carpeting or rugs, vacuuming with a vacuum cleaner equipped with a high-efficiency particulate air (HEPA) filter.  The dust mite allergic individual should not be in a room which is being cleaned and should wait 1 hour after cleaning before going into the room. Do not sleep on upholstered furniture (eg, couches).   If possible removing carpeting, upholstered furniture and drapery from the home is ideal.  Horizontal blinds should be eliminated in the rooms where  the person spends the most time (bedroom, study, television room).  Washable vinyl, roller-type shades are optimal. Remove all non-washable stuffed toys from the bedroom.  Wash stuffed toys weekly like sheets and blankets above.   Reduce indoor humidity to less than 50%.   Inexpensive humidity monitors can be purchased at most hardware stores.  Do not use a humidifier as can make the problem worse and are not recommended.  Allergy Shots  Allergies are the result of a chain reaction that starts in the immune system. Your immune system controls how your body defends itself. For instance, if you have an allergy to pollen, your immune system identifies pollen as an invader or allergen. Your immune system overreacts by producing antibodies called Immunoglobulin E (IgE). These antibodies travel to cells that release chemicals, causing an allergic reaction.  The concept behind allergy immunotherapy, whether it is received in the form of shots or tablets, is that the immune system can be desensitized to specific allergens that trigger allergy symptoms. Although it requires time and patience, the payback can be long-term relief. Allergy injections contain a dilute solution of those substances that you are allergic to based upon your skin testing and allergy history.   How Do Allergy Shots Work?  Allergy shots work much like a vaccine. Your body responds to injected amounts of a particular allergen given in increasing doses, eventually developing a resistance and tolerance to it. Allergy shots can lead to decreased, minimal or no allergy symptoms.  There generally are two phases: build-up and maintenance. Build-up often ranges from three to six months and involves receiving injections with increasing amounts of the allergens. The shots are typically given once or twice a week, though more rapid build-up schedules are sometimes used.  The maintenance phase begins when the most effective dose is reached. This dose is different for each person, depending on how allergic you are and your response to the build-up injections. Once the maintenance dose is reached, there are longer periods between injections, typically two to four weeks.  Occasionally doctors give cortisone-type shots  that can temporarily reduce allergy symptoms. These types of shots are different and should not be confused with allergy immunotherapy shots.  Who Can Be Treated with Allergy Shots?  Allergy shots may be a good treatment approach for people with allergic rhinitis (hay fever), allergic asthma, conjunctivitis (eye allergy) or stinging insect allergy.   Before deciding to begin allergy shots, you should consider:   The length of allergy season and the severity of your symptoms  Whether medications and/or changes to your environment can control your symptoms  Your desire to avoid long-term medication use  Time: allergy immunotherapy requires a major time commitment  Cost: may vary depending on your insurance coverage  Allergy shots for children age 63 and older are effective and often well tolerated. They might prevent the onset of new allergen sensitivities or the progression to asthma.  Allergy shots are not started on patients who are pregnant but can be continued on patients who become pregnant while receiving them. In some patients with other medical conditions or who take certain common medications, allergy shots may be of risk. It is important to mention other medications you talk to your allergist.   What are the two types of build-ups offered:   RUSH or Rapid Desensitization -- one day of injections lasting from 8:30-4:30pm, injections every 1 hour.  Approximately half of the build-up process is completed in that one day.  The following week,  normal build-up is resumed, and this entails ~16 visits either weekly or twice weekly, until reaching your "maintenance dose" which is continued weekly until eventually getting spaced out to every month for a duration of 3 to 5 years. The regular build-up appointments are nurse visits where the injections are administered, followed by required monitoring for 30 minutes.    Traditional build-up -- weekly visits for 6 -12 months until reaching  "maintenance dose", then continue weekly until eventually spacing out to every 4 weeks as above. At these appointments, the injections are administered, followed by required monitoring for 30 minutes.     Either way is acceptable, and both are equally effective. With the rush protocol, the advantage is that less time is spent here for injections overall AND you would also reach maintenance dosing faster (which is when the clinical benefit starts to become more apparent). Not everyone is a candidate for rapid desensitization.   IF we proceed with the RUSH protocol, there are premedications which must be taken the day before and the day after the rush only (this includes antihistamines, steroids, and Singulair ).  After the rush day, no prednisone  or Singulair  is required, and we just recommend antihistamines taken on your injection day.  What Is An Estimate of the Costs?  If you are interested in starting allergy injections, please check with your insurance company about your coverage for both allergy vial sets and allergy injections.  Please do so prior to making the appointment to start injections.  The following are CPT codes to give to your insurance company. These are the amounts we BILL to the insurance company, but the amount YOU WILL PAY and WE RECEIVE IS SUBSTANTIALLY LESS and depends on the contracts we have with different insurance companies.   Amount Billed to Insurance One allergy vial set  CPT 95165   $ 1200     Two allergy vial set  CPT 95165   $ 2400     Three allergy vial set  CPT 95165   $ 3600     One injection   CPT 95115   $ 35  Two injections   CPT 95117   $ 40 RUSH (Rapid Desensitization) CPT 95180 x 8 hours $500/hour  Regarding the allergy injections, your co-pay may or may not apply with each injection, so please confirm this with your insurance company. When you start allergy injections, 1 or 2 sets of vials are made based on your allergies.  Not all patients can be on one  set of vials. A set of vials lasts 6 months to a year depending on how quickly you can proceed with your build-up of your allergy injections. Vials are personalized for each patient depending on their specific allergens.  How often are allergy injection given during the build-up period?   Injections are given at least weekly during the build-up period until your maintenance dose is achieved. Per the doctor's discretion, you may have the option of getting allergy injections two times per week during the build-up period. However, there must be at least 48 hours between injections. The build-up period is usually completed within 6-12 months depending on your ability to schedule injections and for adjustments for reactions. When maintenance dose is reached, your injection schedule is gradually changed to every two weeks and later to every three weeks. Injections will then continue every 4 weeks. Usually, injections are continued for a total of 3-5 years.   When Will I Feel Better?  Some may experience  decreased allergy symptoms during the build-up phase. For others, it may take as long as 12 months on the maintenance dose. If there is no improvement after a year of maintenance, your allergist will discuss other treatment options with you.  If you aren't responding to allergy shots, it may be because there is not enough dose of the allergen in your vaccine or there are missing allergens that were not identified during your allergy testing. Other reasons could be that there are high levels of the allergen in your environment or major exposure to non-allergic triggers like tobacco smoke.  What Is the Length of Treatment?  Once the maintenance dose is reached, allergy shots are generally continued for three to five years. The decision to stop should be discussed with your allergist at that time. Some people may experience a permanent reduction of allergy symptoms. Others may relapse and a longer course of  allergy shots can be considered.  What Are the Possible Reactions?  The two types of adverse reactions that can occur with allergy shots are local and systemic. Common local reactions include very mild redness and swelling at the injection site, which can happen immediately or several hours after. Report a delayed reaction from your last injection. These include arm swelling or runny nose, watery eyes or cough that occurs within 12-24 hours after injection. A systemic reaction, which is less common, affects the entire body or a particular body system. They are usually mild and typically respond quickly to medications. Signs include increased allergy symptoms such as sneezing, a stuffy nose or hives.   Rarely, a serious systemic reaction called anaphylaxis can develop. Symptoms include swelling in the throat, wheezing, a feeling of tightness in the chest, nausea or dizziness. Most serious systemic reactions develop within 30 minutes of allergy shots. This is why it is strongly recommended you wait in your doctor's office for 30 minutes after your injections. Your allergist is trained to watch for reactions, and his or her staff is trained and equipped with the proper medications to identify and treat them.   Report to the nurse immediately if you experience any of the following symptoms: swelling, itching or redness of the skin, hives, watery eyes/nose, breathing difficulty, excessive sneezing, coughing, stomach pain, diarrhea, or light headedness. These symptoms may occur within 15-20 minutes after injection and may require medication.   Who Should Administer Allergy Shots?  The preferred location for receiving shots is your prescribing allergist's office. Injections can sometimes be given at another facility where the physician and staff are trained to recognize and treat reactions, and have received instructions by your prescribing allergist.  What if I am late for an injection?   Injection dose  will be adjusted depending upon how many days or weeks you are late for your injection.   What if I am sick?   Please report any illness to the nurse before receiving injections. She may adjust your dose or postpone injections depending on your symptoms. If you have fever, flu, sinus infection or chest congestion it is best to postpone allergy injections until you are better. Never get an allergy injection if your asthma is causing you problems. If your symptoms persist, seek out medical care to get your health problem under control.  What If I am or Become Pregnant:  Women that become pregnant should schedule an appointment with [facility] before receiving any further allergy injections.

## 2024-06-16 NOTE — Progress Notes (Signed)
 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 Alpha-gal syndrome  - Continue to avoid mammalian meats.  - We will plan to test every 6-12 months or so. - Emergency action plan provided. - EpiPen  is up date.   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 - Testing was negative to pineapple. - We we are getting blood work to this and other requested foods.    3. Keratosis pilaris - Continue Amlactin daily to help clear up the skin pores. - Information on KP provided at the last visit.   4. Chronic rhinitis - Testing today showed: indoor molds, outdoor molds, dust mites, and cat - Copy of test results provided.  - Avoidance measures provided. - Continue with: Xyzal  (levocetirizine) 5mg  tablet once daily and Flonase  (fluticasone ) one spray per nostril daily (AIM FOR EAR ON EACH SIDE) - Start taking: Singulair  (montelukast ) 10mg  daily - Singulair  can cause irritability and bad dreams, so beware. - You can use an extra dose of the antihistamine, if needed, for breakthrough symptoms.  - Consider nasal saline rinses 1-2 times daily to remove allergens from the nasal cavities as well as help with mucous clearance (this is especially helpful to do before the nasal sprays are given) - Strongly allergy shots as a means of long-term control (especially since you have a dog). - Allergy shots re-train and reset the immune system to ignore environmental allergens and decrease the resulting immune response to those allergens (sneezing, itchy watery eyes, runny nose, nasal  congestion, etc).    - Allergy shots improve symptoms in 75-85% of patients.   5. Return in about 2 months (around 08/16/2024). You can have the follow up appointment with Dr. Iva or a Nurse Practicioner (our Nurse Practitioners are excellent and always have Physician oversight!).    Subjective:   Connie Rivera is a 23 y.o. female presenting today for follow up of No chief complaint on file.   Connie Rivera has a history of the following: Patient Active Problem List   Diagnosis Date Noted   Hepatic steatosis 02/12/2022   Liver lesion 02/12/2022   Dorsal cervical fat pad 02/24/2018   Insulin  resistance 04/21/2017   Morbid obesity (HCC) 04/14/2017   Acanthosis nigricans, acquired 04/14/2017   Post-traumatic headache, not intractable 11/23/2016   GAD (generalized anxiety disorder) 10/22/2016   Recurrent major depressive disorder (HCC) 03/04/2016   PTSD (post-traumatic stress disorder) 02/03/2016   Rhinitis, allergic 11/12/2015   Irregular periods 07/10/2015   Severe obesity with body mass index (BMI) greater than 99th percentile for age in childhood [facility]) 07/10/2015   Amenorrhea 07/10/2015   Migraine without aura and without status migrainosus, not intractable 12/19/2014   Tension headache 12/19/2014    History obtained from: chart review and patient.  Discussed the use of AI scribe software for clinical note transcription with the patient and/or guardian, who gave verbal consent to proceed.  Connie Rivera is a 23 y.o. female presenting for skin testing. She was last seen on June 2025. We could not do testing because her insurance company does not cover testing on  the same day as a New Patient visit. She has been off of all antihistamines 3 days in anticipation of the testing.   At that visit, we diagnosed her with syndrome.  We gave her an EpiPen  and an emergency action plan.  We decided to do skin testing for milk allergies as well as environmental allergies.  She had blood work to  milk that was positive, but components were negative.  She was previously eating well this week.  Otherwise, there have been no changes to her past medical history, surgical history, family history, or social history.    Review of systems otherwise negative other than that mentioned in the HPI.    Objective:   unknown if currently breastfeeding. There is no height or weight on file to calculate BMI.    Physical exam deferred since this was a skin testing appointment only.   Diagnostic studies:   Allergy Studies:     Airborne Adult Perc - 06/16/24 0836     Time Antigen Placed 9163    Allergen Manufacturer Jestine    Location Back    Number of Test 55    1. Control-Buffer 50% Glycerol Negative    2. Control-Histamine 3+    3. Bahia Negative    4. French Southern Territories Negative    5. Johnson Negative    6. Kentucky  Blue Negative    7. Meadow Fescue Negative    8. Perennial Rye Negative    9. Timothy Negative    10. Ragweed Mix Negative    11. Cocklebur Negative    12. Plantain,  English Negative    13. Baccharis Negative    14. Dog Fennel Negative    15. Russian Thistle Negative    16. Lamb's Quarters Negative    17. Sheep Sorrell Negative    18. Rough Pigweed Negative    19. Marsh Elder, Rough Negative    20. Mugwort, Common Negative    21. Box, Elder Negative    22. Cedar, red Negative    23. Sweet Gum Negative    24. Pecan Pollen Negative    25. Pine Mix Negative    26. Walnut, Black Pollen Negative    27. Red Mulberry Negative    28. Ash Mix Negative    29. Birch Mix Negative    30. Beech American Negative    31. Cottonwood, Guinea-Bissau Negative    32. Hickory, White Negative    33. Maple Mix Negative    34. Oak, Guinea-Bissau Mix Negative    35. Sycamore Eastern Negative    36. Alternaria Alternata Negative    37. Cladosporium Herbarum Negative    38. Aspergillus Mix Negative    39. Penicillium Mix Negative    40. Bipolaris Sorokiniana (Helminthosporium) Negative    41.  Drechslera Spicifera (Curvularia) Negative    42. Mucor Plumbeus Negative    43. Fusarium Moniliforme Negative    44. Aureobasidium Pullulans (pullulara) Negative    45. Rhizopus Oryzae Negative    46. Botrytis Cinera Negative    47. Epicoccum Nigrum Negative    48. Phoma Betae Negative    49. Dust Mite Mix Negative    50. Cat Hair 10,000 BAU/ml Negative    51.  Dog Epithelia Negative    52. Mixed Feathers Negative    53. Horse Epithelia Negative    54. Cockroach, German Negative    55. Tobacco Leaf Negative          Intradermal - 06/16/24 9081  Time Antigen Placed 9074    Allergen Manufacturer Greer    Location Arm    Number of Test 16    Control Negative    Bahia Negative    French Southern Territories Negative    Johnson Negative    7 Grass Negative    Ragweed Mix Negative    Weed Mix Negative    Tree Mix Negative    Mold 1 3+    Mold 2 3+    Mold 3 3+    Mold 4 3+    Mite Mix 3+    Cat Negative    Dog 3+    Cockroach Negative          Food Adult Perc - 06/16/24 0800     Time Antigen Placed 9163    Allergen Manufacturer Jestine    Location Back     Control-buffer 50% Glycerol Negative    Control-Histamine 3+    5. Milk, Cow Negative    6. Casein Negative          Allergy testing results were read and interpreted by myself, documented by clinical staff.      Marty Shaggy, MD  [facility] 

## 2024-06-18 LAB — IGE MILK W/ COMPONENT REFLEX

## 2024-06-20 ENCOUNTER — Encounter: Payer: Self-pay | Admitting: Allergy & Immunology

## 2024-06-22 LAB — PANEL 603848
F076-IgE Alpha Lactalbumin: <0.1 kU/L
F077-IgE Beta Lactoglobulin: <0.1 kU/L
F078-IgE Casein: <0.1 kU/L

## 2024-06-22 LAB — IGE NUT PROF. W/COMPONENT RFLX
F017-IgE Hazelnut (Filbert): <0.1 kU/L
F018-IgE Brazil Nut: <0.1 kU/L
F020-IgE Almond: <0.1 kU/L
F202-IgE Cashew Nut: <0.1 kU/L
F203-IgE Pistachio Nut: <0.1 kU/L
F256-IgE Walnut: <0.1 kU/L
Macadamia Nut, IgE: <0.1 kU/L
Peanut, IgE: <0.1 kU/L
Pecan Nut IgE: <0.1 kU/L

## 2024-06-22 LAB — ALLERGY PANEL 19, SEAFOOD GROUP
Allergen Salmon IgE: <0.1 kU/L
Catfish: <0.1 kU/L
Codfish IgE: <0.1 kU/L
F023-IgE Crab: <0.1 kU/L
F080-IgE Lobster: <0.1 kU/L
Shrimp IgE: <0.1 kU/L
Tuna: <0.1 kU/L

## 2024-06-22 LAB — ALLERGEN COMPONENT COMMENTS

## 2024-06-22 LAB — ALLERGEN, PINEAPPLE, F210: Pineapple IgE: <0.1 kU/L

## 2024-06-22 LAB — IGE MILK W/ COMPONENT REFLEX: F002-IgE Milk: 0.48 kU/L — AB

## 2024-06-22 LAB — ALLERGEN, WHEAT, F4: Wheat IgE: <0.1 kU/L

## 2024-06-26 ENCOUNTER — Ambulatory Visit: Payer: Self-pay | Admitting: Allergy & Immunology

## 2024-06-28 ENCOUNTER — Encounter: Payer: Self-pay | Admitting: Family Medicine

## 2024-06-28 ENCOUNTER — Telehealth: Payer: Self-pay | Admitting: Allergy & Immunology

## 2024-06-28 ENCOUNTER — Ambulatory Visit (INDEPENDENT_AMBULATORY_CARE_PROVIDER_SITE_OTHER): Admitting: Family Medicine

## 2024-06-28 VITALS — BP 104/69 | HR 79 | Ht 69.0 in | Wt 391.0 lb

## 2024-06-28 DIAGNOSIS — L700 Acne vulgaris: Secondary | ICD-10-CM | POA: Diagnosis not present

## 2024-06-28 NOTE — Telephone Encounter (Signed)
 I talked to the patient and she is not interested at the moment.

## 2024-06-28 NOTE — Telephone Encounter (Signed)
-----   Message from Marty Morton Shaggy sent at 06/16/2024 11:06 AM EDT ----- Did she want to do shots? I did not see an appt for shots, so I did not order them.

## 2024-06-28 NOTE — Progress Notes (Signed)
 BP 104/69   Pulse 79   Ht 5' 9 (1.753 m)   Wt (!) 391 lb (177.4 kg)   SpO2 97%   BMI 57.74 kg/m    Subjective:   Patient ID: Connie Rivera, female    DOB: 11/12/2001, 23 y.o.   MRN: [id number]  HPI: Connie Rivera is a 23 y.o. female presenting on 06/28/2024 for Possible skin tag vs cyst (Pt noticed tender, raised spot on left neck a few days ago. )   HPI Patient comes today complaining of new skin lesion on the left side of her neck that she just popped up over the past couple of days.  She says it does get irritated when she pushes on it.  She said has not been red or warm and she does feel like it has a small little whitehead on it.  She says it is firm does not hurt it when she is not pushing on it.  She denies any fevers or chills or cough or congestion or runny nose.  She denies any other lesions anywhere else.  Relevant past medical, surgical, family and social history reviewed and updated as indicated. Interim medical history since our last visit reviewed. Allergies and medications reviewed and updated.  Review of Systems  Constitutional:  Negative for chills and fever.  Eyes:  Negative for visual disturbance.  Respiratory:  Negative for chest tightness and shortness of breath.   Cardiovascular:  Negative for chest pain and leg swelling.  Musculoskeletal:  Negative for back pain and gait problem.  Skin:  Positive for rash.  Neurological:  Negative for light-headedness and headaches.  Psychiatric/Behavioral:  Negative for agitation and behavioral problems.   All other systems reviewed and are negative.   Per HPI unless specifically indicated above   Allergies as of 06/28/2024       Reactions   Hydrocodone Palpitations   Pineapple Anaphylaxis, Itching   Wound Dressing Adhesive Itching, Rash   Adhesive [tape] Rash   Alpha-gal Hives, Itching, Nausea And Vomiting, Rash, Swelling   Latex Rash        Medication List        Accurate as of June 28, 2024  9:21 AM. If  you have any questions, ask your nurse or doctor.          Amlactin Daily 12 % lotion Generic drug: ammonium lactate Apply 1 Application topically as needed (keratosis pilaris).   EPINEPHrine  0.3 mg/0.3 mL Soaj injection Commonly known as: EPI-PEN Inject 0.3 mg into the muscle as needed for anaphylaxis.   fluticasone  50 MCG/ACT nasal spray Commonly known as: FLONASE  Place 2 sprays into both nostrils daily.   levocetirizine 5 MG tablet Commonly known as: XYZAL  Take 1 tablet (5 mg total) by mouth every evening.   metFORMIN 500 MG 24 hr tablet Commonly known as: GLUCOPHAGE-XR Take 500 mg by mouth 2 (two) times daily with a meal.   montelukast  10 MG tablet Commonly known as: Singulair  Take 1 tablet (10 mg total) by mouth at bedtime.   Wegovy 0.5 MG/0.5ML Soaj Generic drug: Semaglutide-Weight Management Inject 0.5 mg into the skin once a week.         Objective:   BP 104/69   Pulse 79   Ht 5' 9 (1.753 m)   Wt (!) 391 lb (177.4 kg)   SpO2 97%   BMI 57.74 kg/m   Wt Readings from Last 3 Encounters:  06/28/24 (!) 391 lb (177.4 kg)  06/02/24 (!) 361  lb 6 oz (163.9 kg)  05/10/24 (!) 363 lb 6.4 oz (164.8 kg)    Physical Exam Vitals and nursing note reviewed.  Constitutional:      Appearance: Normal appearance. She is obese.  Skin:    Findings: Lesion (Very small white papule with a very small area of induration underneath, less than 0.1 cm.  Mobile and nontender, no overlying erythema.) present.  Neurological:     Mental Status: She is alert.       Assessment & Plan:   Problem List Items Addressed This Visit   None Visit Diagnoses       Cystic acne    -  Primary       Patient has 1 small lesion on her left neck of cystic acne, recommended she do warm compresses and Neosporin on it and if anything gets larger or red or warm to give us  call back. Follow up plan: Return if symptoms worsen or fail to improve.  Counseling provided for all of the vaccine  components No orders of the defined types were placed in this encounter.   Fonda Levins, MD [facility] 06/28/2024, 9:21 AM

## 2024-07-05 DIAGNOSIS — K5900 Constipation, unspecified: Secondary | ICD-10-CM | POA: Diagnosis not present

## 2024-07-05 DIAGNOSIS — J45909 Unspecified asthma, uncomplicated: Secondary | ICD-10-CM | POA: Diagnosis not present

## 2024-07-05 DIAGNOSIS — E782 Mixed hyperlipidemia: Secondary | ICD-10-CM | POA: Diagnosis not present

## 2024-07-05 DIAGNOSIS — Z9189 Other specified personal risk factors, not elsewhere classified: Secondary | ICD-10-CM | POA: Diagnosis not present

## 2024-07-05 DIAGNOSIS — Z6841 Body Mass Index (BMI) 40.0 and over, adult: Secondary | ICD-10-CM | POA: Diagnosis not present

## 2024-07-05 DIAGNOSIS — Z91018 Allergy to other foods: Secondary | ICD-10-CM | POA: Diagnosis not present

## 2024-07-05 DIAGNOSIS — E282 Polycystic ovarian syndrome: Secondary | ICD-10-CM | POA: Diagnosis not present

## 2024-07-05 DIAGNOSIS — K760 Fatty (change of) liver, not elsewhere classified: Secondary | ICD-10-CM | POA: Diagnosis not present

## 2024-07-05 DIAGNOSIS — E559 Vitamin D deficiency, unspecified: Secondary | ICD-10-CM | POA: Diagnosis not present

## 2024-07-05 DIAGNOSIS — E65 Localized adiposity: Secondary | ICD-10-CM | POA: Diagnosis not present

## 2024-08-25 ENCOUNTER — Ambulatory Visit: Admitting: Allergy & Immunology

## 2024-08-25 ENCOUNTER — Encounter: Payer: Self-pay | Admitting: Allergy & Immunology

## 2024-08-25 VITALS — BP 106/70 | HR 82 | Temp 97.9°F | Wt 369.5 lb

## 2024-08-25 DIAGNOSIS — T7807XD Anaphylactic reaction due to milk and dairy products, subsequent encounter: Secondary | ICD-10-CM | POA: Diagnosis not present

## 2024-08-25 DIAGNOSIS — Z91014 Allergy to mammalian meats: Secondary | ICD-10-CM | POA: Diagnosis not present

## 2024-08-25 DIAGNOSIS — L858 Other specified epidermal thickening: Secondary | ICD-10-CM

## 2024-08-25 DIAGNOSIS — J3089 Other allergic rhinitis: Secondary | ICD-10-CM

## 2024-08-25 DIAGNOSIS — J302 Other seasonal allergic rhinitis: Secondary | ICD-10-CM | POA: Diagnosis not present

## 2024-08-25 MED ORDER — TRIAMCINOLONE ACETONIDE 0.5 % EX OINT: 1.0000 | TOPICAL_OINTMENT | Freq: Two times a day (BID) | CUTANEOUS | 1 refills | Status: AC

## 2024-08-25 NOTE — Progress Notes (Signed)
 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 Alpha-gal syndrome  - Continue to avoid mammalian meats.  - We will retest at the next visit.  - Emergency action plan provided. - EpiPen  is up date.   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   3. Keratosis pilaris - Continue Amlactin daily to help clear up the skin pores. - Information on KP provided at the last visit.   4. Pustular lesions - unsure trigger - Start triamcinolone  0.5% ointment twice daily for 1-2 weeks to see if that clears them up.   4. Chronic rhinitis - Testing at the last visit showed: indoor molds, outdoor molds, dust mites, and dog - Continue with: Xyzal  (levocetirizine) 5mg  tablet once daily, Singulair  (montelukast ) 10mg  daily, and Flonase  (fluticasone ) one spray per nostril daily (AIM FOR EAR ON EACH SIDE) - You can use an extra dose of the antihistamine, if needed, for breakthrough symptoms.  - Consider nasal saline rinses 1-2 times daily to remove allergens from the nasal cavities as well as help with mucous clearance (this is especially helpful to do before the nasal sprays are given)  5. Return in about 3 months (around 11/24/2024). You can have the follow up appointment with Dr. Iva or a Nurse Practicioner (our Nurse Practitioners are excellent and always have Physician oversight!).   Subjective:   Connie Rivera is a 23 y.o. female presenting today for follow up of  Chief Complaint  Patient presents with   Follow-up    Requesting an all in one allergy  medication  as she takes 2    Connie Rivera has a history of the following: Patient Active Problem List   Diagnosis Date Noted   Hepatic steatosis 02/12/2022   Liver lesion 02/12/2022   Dorsal cervical fat pad 02/24/2018   Insulin  resistance 04/21/2017   Morbid obesity (HCC) 04/14/2017   Acanthosis nigricans, acquired 04/14/2017   Post-traumatic headache, not intractable 11/23/2016   GAD (generalized anxiety disorder) 10/22/2016   Recurrent major depressive disorder (HCC) 03/04/2016   PTSD (post-traumatic stress disorder) 02/03/2016   Rhinitis, allergic 11/12/2015   Irregular periods 07/10/2015   Severe obesity with body mass index (BMI) greater than 99th percentile for age in childhood (HCC) 07/10/2015   Amenorrhea 07/10/2015   Migraine without aura and without status migrainosus, not intractable 12/19/2014   Tension headache 12/19/2014    History obtained from: chart review and patient.  Discussed the use of AI scribe software for clinical note transcription with the patient and/or guardian, who gave verbal consent to proceed.  Connie Rivera is a 23 y.o. female presenting for a follow up visit.  She was last seen in June 2025.  At that time, we recommended that she continue to avoid mammalian meat.  She had testing that was negative to milk and pineapple at that visit.  We obtained blood work for this.  For her KP, we recommended AmLactin.  She had testing that was positive for multiple indoor and outdoor allergens.  We continue with Xyzal  as well as Flonase  and Singulair .  Since last  visit, she has done relatively well.  Asthma/Respiratory Symptom History: She has a history of asthma from childhood but has not been on any asthma medication for a while. Her breathing is not under good control currently. She has not been on albuterol  in ages.   Allergic Rhinitis Symptom History: She is on allergy  medications, including Xyzal  and Flonase . She has not been on antibiotics for sinus or ear infections. She  has a friend with a similar presentation.   Food Allergy  Symptom History: She avoids red meat due to a known allergy  and has reintroduced milk into her diet, though she avoids whole milk.  Skin Symptom History: She has been experiencing a breakout of skin rashes over the past few days, described as 'little dots' that itch and leave scars. The rashes have appeared on her feet, legs, and other areas. She has been taking Benadryl  to manage the itching, but it has not been effective. The rashes take about thirty days to resolve.  Otherwise, there have been no changes to her past medical history, surgical history, family history, or social history.    Review of systems otherwise negative other than that mentioned in the HPI.    Objective:   Blood pressure 106/70, pulse 82, temperature 97.9 F (36.6 C), weight (!) 369 lb 8 oz (167.6 kg), SpO2 95%, unknown if currently breastfeeding. Body mass index is 54.57 kg/m.    Physical Exam Vitals reviewed.  Constitutional:      Appearance: She is well-developed and overweight. She is not ill-appearing, toxic-appearing or diaphoretic.     Comments: Talkative.  Bubbly.  HENT:     Head: Normocephalic and atraumatic.     Right Ear: Tympanic membrane, ear canal and external ear normal. No drainage, swelling or tenderness. Tympanic membrane is not injected, scarred, erythematous, retracted or bulging.     Left Ear: Tympanic membrane, ear canal and external ear normal. No drainage, swelling or tenderness. Tympanic membrane is not injected, scarred, erythematous, retracted or bulging.     Nose: No nasal deformity, septal deviation, mucosal edema or rhinorrhea.     Right Turbinates: Enlarged, swollen and pale.     Left Turbinates: Enlarged, swollen and pale.     Right Sinus: No maxillary sinus tenderness or frontal sinus tenderness.     Left Sinus: No maxillary sinus tenderness or frontal sinus tenderness.     Mouth/Throat:     Lips: Pink.     Mouth:  Mucous membranes are moist. Mucous membranes are not pale and not dry.     Pharynx: Uvula midline.  Eyes:     General: Allergic shiner present.        Right eye: No discharge.        Left eye: No discharge.     Conjunctiva/sclera: Conjunctivae normal.     Right eye: Right conjunctiva is not injected. No chemosis.    Left eye: Left conjunctiva is not injected. No chemosis.    Pupils: Pupils are equal, round, and reactive to light.  Cardiovascular:     Rate and Rhythm: Normal rate and regular rhythm.     Heart sounds: Normal heart sounds.  Pulmonary:     Effort: Pulmonary effort is normal. No tachypnea, accessory muscle usage or respiratory distress.     Breath sounds: Normal breath sounds. No wheezing, rhonchi or rales.     Comments: No melena lung fields.  No increased work of breathing. Chest:     Chest wall: No tenderness.  Abdominal:  Tenderness: There is no abdominal tenderness. There is no guarding or rebound.  Lymphadenopathy:     Head:     Right side of head: No submandibular, tonsillar or occipital adenopathy.     Left side of head: No submandibular, tonsillar or occipital adenopathy.     Cervical: No cervical adenopathy.  Skin:    General: Skin is warm.     Capillary Refill: Capillary refill takes less than 2 seconds.     Coloration: Skin is not pale.     Findings: No abrasion, erythema, petechiae or rash. Rash is not papular, urticarial or vesicular.     Comments: No hives.  Neurological:     Mental Status: She is alert.  Psychiatric:        Behavior: Behavior is cooperative.      Diagnostic studies: none        Marty Shaggy, MD  [facility] 

## 2024-08-25 NOTE — Patient Instructions (Addendum)
 1. Alpha-gal syndrome  - Continue to avoid mammalian meats.  - We will retest at the next visit.  - Emergency action plan provided. - EpiPen  is up date.   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   3. Keratosis pilaris - Continue Amlactin daily to help clear up the skin pores. - Information on KP provided at the last visit.   4. Pustular lesions - unsure trigger - Start triamcinolone  0.5% ointment twice daily for 1-2 weeks to see if that clears them up.   4. Chronic rhinitis - Testing at the last visit showed: indoor molds, outdoor molds, dust mites, and dog - Continue with: Xyzal  (levocetirizine) 5mg  tablet once daily, Singulair  (montelukast ) 10mg  daily, and Flonase  (fluticasone ) one spray per nostril daily (AIM FOR EAR ON EACH SIDE) - You can use an extra dose of the antihistamine, if needed, for breakthrough symptoms.  - Consider nasal saline rinses 1-2 times daily to remove allergens from the nasal cavities as well as help with mucous clearance (this is especially helpful to do before the nasal sprays are given)  5. Return in about 3 months (around 11/24/2024). You can have the follow up appointment with Dr. Iva or a Nurse Practicioner (our Nurse Practitioners are excellent and always have Physician oversight!).    Please inform us  of any Emergency Department visits, hospitalizations, or changes in symptoms. Call us  before going to the ED for breathing or allergy  symptoms since we might be able to fit you in for a sick visit. Feel free to contact us  anytime with any questions, problems, or concerns.  It was a pleasure to see you again today!  Websites that have reliable patient information: 1. American Academy of Asthma, Allergy , and Immunology: www.aaaai.org 2. Food Allergy  Research and Education (FARE): foodallergy.org 3. Mothers of Asthmatics: http://www.asthmacommunitynetwork.org 4. American College of Allergy , Asthma, and  Immunology: www.acaai.org      "Like" us  on Facebook and Instagram for our latest updates!      A healthy democracy works best when [employer] participate! Make sure you are registered to vote! If you have moved or changed any of your contact information, you will need to get this updated before voting! Scan the QR codes below to learn more!

## 2024-09-06 DIAGNOSIS — J45909 Unspecified asthma, uncomplicated: Secondary | ICD-10-CM | POA: Diagnosis not present

## 2024-09-06 DIAGNOSIS — E782 Mixed hyperlipidemia: Secondary | ICD-10-CM | POA: Diagnosis not present

## 2024-09-06 DIAGNOSIS — K5900 Constipation, unspecified: Secondary | ICD-10-CM | POA: Diagnosis not present

## 2024-09-06 DIAGNOSIS — E65 Localized adiposity: Secondary | ICD-10-CM | POA: Diagnosis not present

## 2024-09-06 DIAGNOSIS — E559 Vitamin D deficiency, unspecified: Secondary | ICD-10-CM | POA: Diagnosis not present

## 2024-09-06 DIAGNOSIS — E282 Polycystic ovarian syndrome: Secondary | ICD-10-CM | POA: Diagnosis not present

## 2024-09-06 DIAGNOSIS — K760 Fatty (change of) liver, not elsewhere classified: Secondary | ICD-10-CM | POA: Diagnosis not present

## 2024-09-06 DIAGNOSIS — Z91018 Allergy to other foods: Secondary | ICD-10-CM | POA: Diagnosis not present

## 2024-09-06 DIAGNOSIS — Z6841 Body Mass Index (BMI) 40.0 and over, adult: Secondary | ICD-10-CM | POA: Diagnosis not present

## 2024-10-06 ENCOUNTER — Telehealth: Payer: Self-pay

## 2024-10-06 DIAGNOSIS — Z7189 Other specified counseling: Secondary | ICD-10-CM

## 2024-10-06 NOTE — Progress Notes (Signed)
 Complex Care Management Note  Care Guide Note 10/06/2024 Name: Connie Rivera MRN: [id number] DOB: Jun 29, 2001  Connie Rivera is a 23 y.o. year old female who sees Joesph Annabella CHRISTELLA, FNP for primary care. I reached out to Connie Rivera by phone today to offer complex care management services.  Connie Rivera was given information about Complex Care Management services today including:   The Complex Care Management services include support from the care team which includes your Nurse Care Manager, Clinical Social Worker, or Pharmacist.  The Complex Care Management team is here to help remove barriers to the health concerns and goals most important to you. Complex Care Management services are voluntary, and the patient may decline or stop services at any time by request to their care team member.   Complex Care Management Consent Status: Patient agreed to services and verbal consent obtained.   Follow up plan:  Telephone appointment with complex care management team member scheduled for:  11/03/2024  Encounter Outcome:  Patient Scheduled  .aws

## 2024-10-10 ENCOUNTER — Other Ambulatory Visit: Payer: Self-pay | Admitting: Allergy & Immunology

## 2024-10-11 DIAGNOSIS — E282 Polycystic ovarian syndrome: Secondary | ICD-10-CM | POA: Diagnosis not present

## 2024-10-11 DIAGNOSIS — E65 Localized adiposity: Secondary | ICD-10-CM | POA: Diagnosis not present

## 2024-10-11 DIAGNOSIS — E559 Vitamin D deficiency, unspecified: Secondary | ICD-10-CM | POA: Diagnosis not present

## 2024-10-11 DIAGNOSIS — Z6841 Body Mass Index (BMI) 40.0 and over, adult: Secondary | ICD-10-CM | POA: Diagnosis not present

## 2024-10-11 DIAGNOSIS — K7581 Nonalcoholic steatohepatitis (NASH): Secondary | ICD-10-CM | POA: Diagnosis not present

## 2024-10-11 DIAGNOSIS — E782 Mixed hyperlipidemia: Secondary | ICD-10-CM | POA: Diagnosis not present

## 2024-10-20 ENCOUNTER — Other Ambulatory Visit: Payer: Self-pay | Admitting: Allergy & Immunology

## 2024-11-01 ENCOUNTER — Encounter: Payer: Self-pay | Admitting: Family Medicine

## 2024-11-01 ENCOUNTER — Ambulatory Visit: Admitting: Family Medicine

## 2024-11-01 VITALS — BP 120/82 | HR 77 | Temp 97.1°F | Ht 69.0 in | Wt 366.4 lb

## 2024-11-01 DIAGNOSIS — J069 Acute upper respiratory infection, unspecified: Secondary | ICD-10-CM | POA: Diagnosis not present

## 2024-11-01 NOTE — Progress Notes (Signed)
 Acute Office Visit  Subjective:     Patient ID: Connie Rivera, female    DOB: 07/02/2001, 23 y.o.   MRN: [id number]  Chief Complaint  Patient presents with   Sore Throat    Sore Throat  This is a new problem. The current episode started in the past 7 days (2 days). The problem has been waxing and waning. The pain is worse on the right side. There has been no fever. Associated symptoms include congestion and diarrhea. Pertinent negatives include no abdominal pain, coughing, drooling, ear discharge, ear pain, headaches, shortness of breath, stridor, trouble swallowing or vomiting. She has had no exposure to strep. She has tried nothing for the symptoms. The treatment provided no relief.   She is mostly concerned about getting her baby sick.   Review of Systems  HENT:  Positive for congestion. Negative for drooling, ear discharge, ear pain and trouble swallowing.   Respiratory:  Negative for cough, shortness of breath and stridor.   Gastrointestinal:  Positive for diarrhea. Negative for abdominal pain and vomiting.  Neurological:  Negative for headaches.        Objective:    BP 120/82   Pulse 77   Temp (!) 97.1 F (36.2 C) (Temporal)   Ht 5' 9 (1.753 m)   Wt (!) 366 lb 6.4 oz (166.2 kg)   SpO2 97%   BMI 54.11 kg/m  Wt Readings from Last 3 Encounters:  11/01/24 (!) 366 lb 6.4 oz (166.2 kg)  08/25/24 (!) 369 lb 8 oz (167.6 kg)  06/28/24 (!) 391 lb (177.4 kg)      Physical Exam Vitals and nursing note reviewed.  Constitutional:      General: She is not in acute distress.    Appearance: She is obese. She is not ill-appearing, toxic-appearing or diaphoretic.  HENT:     Head: Normocephalic and atraumatic.     Right Ear: Tympanic membrane and ear canal normal.     Left Ear: Tympanic membrane and ear canal normal.     Nose: Congestion present.     Mouth/Throat:     Mouth: Mucous membranes are moist. No oral lesions.     Pharynx: Posterior oropharyngeal erythema  present. No pharyngeal swelling, oropharyngeal exudate or uvula swelling.     Tonsils: No tonsillar exudate or tonsillar abscesses. 1+ on the right. 1+ on the left.  Eyes:     Conjunctiva/sclera: Conjunctivae normal.  Cardiovascular:     Rate and Rhythm: Normal rate and regular rhythm.     Heart sounds: Normal heart sounds. No murmur heard. Pulmonary:     Effort: Pulmonary effort is normal.     Breath sounds: Normal breath sounds.  Musculoskeletal:     Cervical back: Neck supple.  Lymphadenopathy:     Cervical: No cervical adenopathy.  Skin:    General: Skin is warm.  Neurological:     General: No focal deficit present.     Mental Status: She is alert and oriented to person, place, and time.  Psychiatric:        Mood and Affect: Mood normal.        Behavior: Behavior normal.     No results found for any visits on 11/01/24.      Assessment & Plan:   Oreta was seen today for sore throat.  Diagnoses and all orders for this visit:  Viral URI Discussed symptomatic care and return precautions.   Return to office for new or worsening symptoms, or if  symptoms persist.   The patient indicates understanding of these issues and agrees with the plan.   Annabella CHRISTELLA Search, FNP

## 2024-11-03 ENCOUNTER — Other Ambulatory Visit: Payer: Self-pay | Admitting: *Deleted

## 2024-11-03 NOTE — Patient Outreach (Signed)
 Complex Care Management   Visit Note  11/03/2024  Name:  Connie Rivera MRN: [id number] DOB: May 20, 2001  Situation: Referral received for Complex Care Management related to Health Plan Referral I obtained verbal consent from Patient.  Visit completed with Patient  on the phone  Background:   Past Medical History:  Diagnosis Date   Amenorrhea 07/10/2015   Asthma    Depression    Irregular periods 07/10/2015   Obesity    Scoliosis    Sleep apnea     Assessment: Patient Reported Symptoms:  Cognitive Cognitive Status: No symptoms reported Cognitive/Intellectual Conditions Management [RPT]: None reported or documented in medical history or problem list   Health Maintenance Behaviors: Annual physical exam Healing Pattern: Average Health Facilitated by: Rest  Neurological Neurological Review of Symptoms: No symptoms reported Neurological Management Strategies: Routine screening Neurological Self-Management Outcome: 4 (good)  HEENT HEENT Symptoms Reported: Sore throat HEENT Management Strategies: Routine screening HEENT Self-Management Outcome: 4 (good)    Cardiovascular Cardiovascular Symptoms Reported: No symptoms reported Does patient have uncontrolled Hypertension?: No Cardiovascular Management Strategies: Routine screening Cardiovascular Self-Management Outcome: 4 (good)  Respiratory Respiratory Symptoms Reported: No symptoms reported Respiratory Management Strategies: Routine screening Respiratory Self-Management Outcome: 4 (good)  Endocrine Endocrine Symptoms Reported: No symptoms reported Is patient diabetic?: No Endocrine Self-Management Outcome: 4 (good)  Gastrointestinal Gastrointestinal Symptoms Reported: No symptoms reported Gastrointestinal Self-Management Outcome: 4 (good)    Genitourinary Genitourinary Symptoms Reported: No symptoms reported Genitourinary Self-Management Outcome: 4 (good)  Integumentary Integumentary Symptoms Reported: No symptoms  reported Skin Management Strategies: Routine screening Skin Self-Management Outcome: 4 (good)  Musculoskeletal Musculoskelatal Symptoms Reviewed: No symptoms reported Musculoskeletal Management Strategies: Routine screening Musculoskeletal Self-Management Outcome: 4 (good) Falls in the past year?: No Number of falls in past year: 1 or less Was there an injury with Fall?: No Fall Risk Category Calculator: 0 Patient Fall Risk Level: Low Fall Risk Patient at Risk for Falls Due to: No Fall Risks Fall risk Follow up: Falls evaluation completed  Psychosocial Psychosocial Symptoms Reported: No symptoms reported Behavioral Management Strategies: Support system Behavioral Health Self-Management Outcome: 4 (good) Major Change/Loss/Stressor/Fears (CP): Denies Techniques to Cope with Loss/Stress/Change: Not applicable Quality of Family Relationships: helpful, involved, supportive Do you feel physically threatened by others?: No    11/03/2024    PHQ2-9 Depression Screening   Little interest or pleasure in doing things Not at all  Feeling down, depressed, or hopeless Not at all  PHQ-2 - Total Score 0  Trouble falling or staying asleep, or sleeping too much    Feeling tired or having little energy    Poor appetite or overeating     Feeling bad about yourself - or that you are a failure or have let yourself or your family down    Trouble concentrating on things, such as reading the newspaper or watching television    Moving or speaking so slowly that other people could have noticed.  Or the opposite - being so fidgety or restless that you have been moving around a lot more than usual    Thoughts that you would be better off dead, or hurting yourself in some way    PHQ2-9 Total Score    If you checked off any problems, how difficult have these problems made it for you to do your work, take care of things at home, or get along with other people    Depression Interventions/Treatment      There  were no vitals filed for this visit.  Pain Scale: 0-10 Pain Score: 0-No pain  Medications Reviewed Today     Reviewed by Bertrum Rosina HERO, RN (Registered Nurse) on 11/03/24 at [phone]  Med List Status: <None>   Medication Order Taking? Sig Documenting Provider Last Dose Status Informant  AMLACTIN DAILY 12 % lotion [id number] Yes Apply 1 Application topically as needed (keratosis pilaris). Iva Marty Saltness, MD  Active   EPINEPHrine  0.3 mg/0.3 mL IJ SOAJ injection [id number] Yes Inject 0.3 mg into the muscle as needed for anaphylaxis. Iva Marty Saltness, MD  Active   fluticasone  (FLONASE ) 50 MCG/ACT nasal spray [id number] Yes Place 2 sprays into both nostrils daily. Iva Marty Saltness, MD  Active   metFORMIN (GLUCOPHAGE-XR) 500 MG 24 hr tablet [id number] Yes Take 500 mg by mouth 2 (two) times daily with a meal. [provider]  Active   montelukast  (SINGULAIR ) 10 MG tablet [id number] Yes Take 1 tablet (10 mg total) by mouth at bedtime. Iva Marty Saltness, MD  Active   triamcinolone  ointment (KENALOG ) 0.5 % [id number] Yes Apply 1 Application topically 2 (two) times daily. Use for 1-2 weeks until lesions are cleared. DO NOT use on the face. Iva Marty Saltness, MD  Active   ZEPBOUND 2.5 MG/0.5ML Pen [id number] Yes 0.5 mL Subcutaneous weekly; Duration: 30 days [provider]  Active             Recommendation:   Continue Current Plan of Care  Follow Up Plan:   Telephone follow-up in 12 days  Rosina Bertrum, BSN RN [facility], [facility] RN Care Manager Direct Dial: [phone]  Fax: [phone]

## 2024-11-03 NOTE — Patient Instructions (Signed)
 Visit Information  Connie Rivera was given information about Medicaid Managed Care team care coordination services as a part of their Healthy [facility] Medicaid benefit. Connie Rivera   If you would like to schedule transportation through your Healthy [facility] plan, please call the following number at least 2 days in advance of your appointment: [phone]  For information about your ride after you set it up, call Ride Assist at [phone]. Use this number to activate a Will Call pickup, or if your transportation is late for a scheduled pickup. Use this number, too, if you need to make a change or cancel a previously scheduled reservation.  If you need transportation services right away, call [phone]. The after-hours call center is staffed 24 hours to handle ride assistance and urgent reservation requests (including discharges) 365 days a year. Urgent trips include sick visits, hospital discharge requests and life-sustaining treatment.  Call the [employer] Line at [phone], at any time, 24 hours a day, 7 days a week. If you are in danger or need immediate medical attention call 911.   Please see education materials related to Health Maintenance & Dental Care provided by MyChart link.  Patient verbalizes understanding of instructions and care plan provided today and agrees to view in MyChart. Active MyChart status and patient understanding of how to access instructions and care plan via MyChart confirmed with patient.     Telephone follow up appointment with Managed Medicaid care management team member scheduled for:11-15-2024 at 9:30 am  Rosina Forte, BSN RN [facility], [facility] RN Care Manager Direct Dial: [phone]  Fax: [phone]   Following is a copy of your plan of care:  There are no care plans that you recently modified to display for this patient.

## 2024-11-08 ENCOUNTER — Ambulatory Visit (INDEPENDENT_AMBULATORY_CARE_PROVIDER_SITE_OTHER): Admitting: Family Medicine

## 2024-11-08 ENCOUNTER — Encounter: Payer: Self-pay | Admitting: Family Medicine

## 2024-11-08 DIAGNOSIS — Z6841 Body Mass Index (BMI) 40.0 and over, adult: Secondary | ICD-10-CM | POA: Diagnosis not present

## 2024-11-08 DIAGNOSIS — K7581 Nonalcoholic steatohepatitis (NASH): Secondary | ICD-10-CM

## 2024-11-08 NOTE — Progress Notes (Signed)
   Established Patient Office Visit  Subjective   Patient ID: Connie Rivera, female    DOB: [birth year]  Age: 23 y.o. MRN: [id number]  Chief Complaint  Patient presents with   Obesity    HPI  History of Present Illness   Connie Rivera is a 23 year old female who presents for evaluation of weight loss medication coverage and staging for fatty liver disease.  Weight management and pharmacotherapy - Currently using Zepbound for weight loss after discontinuing Wegovy due to loss of insurance coverage for weight loss indication. - Previously achieved weight loss with phentermine. - Actively motivated to lose weight and manage liver health. - Concerned about the cost of Wegovy without insurance, approximately $500 per month. - Seeking evaluation for potential continuation of Wegovy, which is also indicated for fatty liver disease.  Nonalcoholic fatty liver disease staging - History of elevated Fib scores, which improved prior to most recent pregnancy but increased again during pregnancy. - Last known staging was F0. - No prior ultrasound performed or imaging for liver disease staging.        ROS As per HPI.    Objective:     BP 112/69   Pulse 74   Temp 97.8 F (36.6 C) (Temporal)   Ht 5' 9 (1.753 m)   Wt (!) 361 lb 12.8 oz (164.1 kg)   SpO2 96%   BMI 53.43 kg/m  Wt Readings from Last 3 Encounters:  11/08/24 (!) 361 lb 12.8 oz (164.1 kg)  11/01/24 (!) 366 lb 6.4 oz (166.2 kg)  08/25/24 (!) 369 lb 8 oz (167.6 kg)      Physical Exam Vitals and nursing note reviewed.  Constitutional:      General: She is not in acute distress.    Appearance: She is obese. She is not ill-appearing, toxic-appearing or diaphoretic.  Pulmonary:     Effort: Pulmonary effort is normal. No respiratory distress.  Musculoskeletal:     Right lower leg: No edema.     Left lower leg: No edema.  Skin:    General: Skin is warm and dry.  Neurological:     General: No focal deficit present.      Mental Status: She is alert and oriented to person, place, and time.  Psychiatric:        Mood and Affect: Mood normal.        Behavior: Behavior normal.      No results found for any visits on 11/08/24.    The ASCVD Risk score (Arnett DK, et al., 2019) failed to calculate for the following reasons:   The 2019 ASCVD risk score is only valid for ages 18 to 30    Assessment & Plan:   Phil was seen today for obesity.  Diagnoses and all orders for this visit:  Morbid obesity (HCC)  Metabolic dysfunction-associated steatohepatitis (MASH) -     CMP14+EGFR -     CBC with Differential/Platelet  BMI 50.0-59.9, adult (HCC)   Assessment and Plan    Morbid Obesity with BMI 53 Nonalcoholic steatohepatitis (NASH) NASH with previous F0 fibrosis in 2023. Insurance for [employer] requires F1 fibrosis. Current insurance expiring January 31. - Ordered liver enzymes and platelet for Fib-4 score. - Referral for scan with elastography if Fib-4 elevated.      Connie CHRISTELLA Search, FNP

## 2024-11-08 NOTE — Patient Instructions (Signed)
https://www.healthcare.gov/

## 2024-11-09 ENCOUNTER — Ambulatory Visit: Payer: Self-pay | Admitting: Family Medicine

## 2024-11-09 DIAGNOSIS — K7581 Nonalcoholic steatohepatitis (NASH): Secondary | ICD-10-CM

## 2024-11-09 LAB — CMP14+EGFR
ALT: 47 IU/L — ABNORMAL HIGH (ref 0–32)
AST: 39 IU/L (ref 0–40)
Albumin: 4.2 g/dL (ref 4.0–5.0)
Alkaline Phosphatase: 56 IU/L (ref 41–116)
BUN/Creatinine Ratio: 13 (ref 9–23)
BUN: 12 mg/dL (ref 6–20)
Bilirubin Total: 0.5 mg/dL (ref 0.0–1.2)
CO2: 22 mmol/L (ref 20–29)
Calcium: 9.4 mg/dL (ref 8.7–10.2)
Chloride: 109 mmol/L — ABNORMAL HIGH (ref 96–106)
Creatinine, Ser: 0.91 mg/dL (ref 0.57–1.00)
Globulin, Total: 2.5 g/dL (ref 1.5–4.5)
Glucose: 85 mg/dL (ref 70–99)
Potassium: 3.9 mmol/L (ref 3.5–5.2)
Sodium: 147 mmol/L — ABNORMAL HIGH (ref 134–144)
Total Protein: 6.7 g/dL (ref 6.0–8.5)
eGFR: 90 mL/min/1.73 (ref >59)

## 2024-11-09 LAB — CBC WITH DIFFERENTIAL/PLATELET
Basophils Absolute: 0.1 x10E3/uL (ref 0.0–0.2)
Basos: 1 %
EOS (ABSOLUTE): 0.1 x10E3/uL (ref 0.0–0.4)
Eos: 1 %
Hematocrit: 46.1 % (ref 34.0–46.6)
Hemoglobin: 14.8 g/dL (ref 11.1–15.9)
Immature Grans (Abs): 0 x10E3/uL (ref 0.0–0.1)
Immature Granulocytes: 0 %
Lymphocytes Absolute: 2.5 x10E3/uL (ref 0.7–3.1)
Lymphs: 34 %
MCH: 28.4 pg (ref 26.6–33.0)
MCHC: 32.1 g/dL (ref 31.5–35.7)
MCV: 88 fL (ref 79–97)
Monocytes Absolute: 0.5 x10E3/uL (ref 0.1–0.9)
Monocytes: 7 %
Neutrophils Absolute: 4.1 x10E3/uL (ref 1.4–7.0)
Neutrophils: 57 %
Platelets: 235 x10E3/uL (ref 150–450)
RBC: 5.22 x10E6/uL (ref 3.77–5.28)
RDW: 13 % (ref 11.7–15.4)
WBC: 7.3 x10E3/uL (ref 3.4–10.8)

## 2024-11-10 NOTE — Telephone Encounter (Signed)
 Referral placed.

## 2024-11-15 ENCOUNTER — Encounter: Payer: Self-pay | Admitting: *Deleted

## 2024-11-15 ENCOUNTER — Other Ambulatory Visit: Payer: Self-pay | Admitting: *Deleted

## 2024-11-15 NOTE — Patient Instructions (Signed)
 Blythe CHRISTELLA Ford - I am sorry I was unable to reach you today for our scheduled appointment. I work with Joesph Annabella CHRISTELLA, FNP and am calling to support your healthcare needs. Please contact me at [phone] at your earliest convenience. I look forward to speaking with you soon.   Thank you,  Rosina Forte, BSN RN [facility], [facility] RN Care Manager Direct Dial: [phone]  Fax: [phone]

## 2024-11-22 DIAGNOSIS — H5213 Myopia, bilateral: Secondary | ICD-10-CM | POA: Diagnosis not present

## 2024-11-27 ENCOUNTER — Other Ambulatory Visit: Payer: Self-pay | Admitting: *Deleted

## 2024-11-27 NOTE — Patient Instructions (Signed)
 Visit Information  Connie Rivera was given information about Medicaid Managed Care team care coordination services as a part of their Healthy [facility] Medicaid benefit. Connie Rivera   If you would like to schedule transportation through your Healthy [facility] plan, please call the following number at least 2 days in advance of your appointment: [phone]  For information about your ride after you set it up, call Ride Assist at [phone]. Use this number to activate a Will Call pickup, or if your transportation is late for a scheduled pickup. Use this number, too, if you need to make a change or cancel a previously scheduled reservation.  If you need transportation services right away, call [phone]. The after-hours call center is staffed 24 hours to handle ride assistance and urgent reservation requests (including discharges) 365 days a year. Urgent trips include sick visits, hospital discharge requests and life-sustaining treatment.  Call the [facility] Line at [phone], at any time, 24 hours a day, 7 days a week. If you are in danger or need immediate medical attention call 911.   Please see education materials related to Health Maintenance provided by MyChart link.  Patient verbalizes understanding of instructions and care plan provided today and agrees to view in MyChart. Active MyChart status and patient understanding of how to access instructions and care plan via MyChart confirmed with patient.     Telephone follow up appointment with Managed Medicaid care management team member scheduled for:12-28-2024 at 2:00 pm   Rosina Forte, BSN RN [facility], [facility] RN Care Manager Direct Dial: [phone]  Fax: [phone]   Following is a copy of your plan of care:   Goals Addressed             This Visit's Progress    [facility] RN Care Plan : Health Maintenance   Improving    Problems:  Care Coordination needs related  to Health Maintenance  Goal: Over the next 90 days the Patient will attend all scheduled medical appointments: with primary care provider and specialist as evidenced by keeping all scheduled appointments        continue to work with RN Care Manager and/or Social Worker to address care management and care coordination needs related to health maintenance as evidenced by adherence to care management team scheduled appointments     take all medications exactly as prescribed and will call provider for medication related questions as evidenced by compliance with all medications     Interventions:   Health Maintenance Interventions: Patient interviewed about adult health maintenance status including  Regular Dental Care. Patient reports that she has not been to the dentist due to her diagnosis of Alpha Gal and most dental offices only use animal products. She does endorse needing a visit. Reports that she has not made an appointment, requested information be given to her husband. Advised to discuss Regular Dental Care with primary care provider due to alpha gal syndrome Coordination of Care to address Dental Health needs - [facility] [phone]. Left voicemail to see if they can accommodate animal free derivative products.  Patient Self-Care Activities:  Attend all scheduled provider appointments Call provider office for new concerns or questions  Take medications as prescribed    Plan:  Telephone follow up appointment with care management team member scheduled for:  12-28-2024 at 2:00 pm

## 2024-11-27 NOTE — Patient Outreach (Signed)
 Complex Care Management   Visit Note  11/27/2024  Name:  COLLINS DIMARIA MRN: [id number] DOB: June 13, 2001  Situation: Referral received for Complex Care Management related to  [facility] Maintenance obtained verbal consent from Patient.  Visit completed with Patient  on the phone  Background:   Past Medical History:  Diagnosis Date   Amenorrhea 07/10/2015   Asthma    Depression    Irregular periods 07/10/2015   Obesity    Scoliosis    Sleep apnea     Assessment: Patient Reported Symptoms:  Cognitive Cognitive Status: No symptoms reported Cognitive/Intellectual Conditions Management [RPT]: None reported or documented in medical history or problem list   Health Maintenance Behaviors: Annual physical exam Healing Pattern: Average Health Facilitated by: Rest  Neurological Neurological Review of Symptoms: No symptoms reported    HEENT HEENT Symptoms Reported: Sore throat HEENT Management Strategies: Routine screening HEENT Self-Management Outcome: 4 (good)    Cardiovascular Cardiovascular Symptoms Reported: No symptoms reported    Respiratory Respiratory Symptoms Reported: No symptoms reported Respiratory Management Strategies: Routine screening  Endocrine Endocrine Symptoms Reported: No symptoms reported Is patient diabetic?: No Endocrine Self-Management Outcome: 4 (good)  Gastrointestinal Gastrointestinal Symptoms Reported: No symptoms reported      Genitourinary Genitourinary Symptoms Reported: No symptoms reported    Integumentary Integumentary Symptoms Reported: No symptoms reported    Musculoskeletal Musculoskelatal Symptoms Reviewed: Not assessed        Psychosocial Psychosocial Symptoms Reported: No symptoms reported   Major Change/Loss/Stressor/Fears (CP): Denies      11/27/2024    PHQ2-9 Depression Screening   Little interest or pleasure in doing things Not at all  Feeling down, depressed, or hopeless Not at all  PHQ-2 - Total Score 0  Trouble falling or  staying asleep, or sleeping too much    Feeling tired or having little energy    Poor appetite or overeating     Feeling bad about yourself - or that you are a failure or have let yourself or your family down    Trouble concentrating on things, such as reading the newspaper or watching television    Moving or speaking so slowly that other people could have noticed.  Or the opposite - being so fidgety or restless that you have been moving around a lot more than usual    Thoughts that you would be better off dead, or hurting yourself in some way    PHQ2-9 Total Score    If you checked off any problems, how difficult have these problems made it for you to do your work, take care of things at home, or get along with other people    Depression Interventions/Treatment      There were no vitals filed for this visit. Pain Score: 0-No pain  Medications Reviewed Today     Reviewed by Bertrum Rosina CHRISTELLA, RN (Registered Nurse) on 11/27/24 at 1401  Med List Status: <None>   Medication Order Taking? Sig Documenting Provider Last Dose Status Informant  AMLACTIN DAILY 12 % lotion [id number] Yes Apply 1 Application topically as needed (keratosis pilaris). Iva Marty Saltness, MD  Active   EPINEPHrine  0.3 mg/0.3 mL IJ SOAJ injection [id number] Yes Inject 0.3 mg into the muscle as needed for anaphylaxis. Iva Marty Saltness, MD  Active   fluticasone  (FLONASE ) 50 MCG/ACT nasal spray [id number] Yes Place 2 sprays into both nostrils daily. Iva Marty Saltness, MD  Active   metFORMIN (GLUCOPHAGE-XR) 500 MG 24 hr tablet [id number] Yes Take 500 mg by  mouth 2 (two) times daily with a meal. [provider]  Active   montelukast  (SINGULAIR ) 10 MG tablet [id number] Yes Take 1 tablet (10 mg total) by mouth at bedtime. Iva Marty Saltness, MD  Active   triamcinolone  ointment (KENALOG ) 0.5 % [id number] Yes Apply 1 Application topically 2 (two) times daily. Use for 1-2 weeks until lesions are cleared. DO NOT use  on the face. Iva Marty Saltness, MD  Active   ZEPBOUND 2.5 MG/0.5ML Pen [id number] Yes 0.5 mL Subcutaneous weekly; Duration: 30 days [provider]  Active             Recommendation:   Continue Current Plan of Care  Follow Up Plan:   Telephone follow-up in 1 month  Rosina Forte, BSN RN [facility], [facility] RN Care Manager Direct Dial: [phone]  Fax: [phone]

## 2024-11-28 NOTE — Patient Instructions (Incomplete)
 1. Alpha-gal syndrome  - Continue to avoid mammalian meats.  - We will get lab work to follow up on this. We will call you with results once they are back - EpiPen  is up date.   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   3. Keratosis pilaris - Continue Amlactin daily to help clear up the skin pores. - Information on KP provided at previous visit.   4. Pustular lesions - unsure trigger - Continuetriamcinolone 0.5% ointment twice daily for 1-2 weeks to see if that clears them up.   4. Allergic rhinitis - Testing at the last visit showed: indoor molds, outdoor molds, dust mites, and dog - Continue with: Xyzal  (levocetirizine) 5mg  tablet once daily, Singulair  (montelukast ) 10mg  daily, and Flonase  (fluticasone ) one spray per nostril daily (AIM FOR EAR ON EACH SIDE) - You can use an extra dose of the antihistamine, if needed, for breakthrough symptoms.  - Consider nasal saline rinses 1-2 times daily to remove allergens from the nasal cavities as well as help with mucous clearance (this is especially helpful to do before the nasal sprays are given)  5. Follow up in months or sooner if needed   Please inform us  of any Emergency Department visits, hospitalizations, or changes in symptoms. Call us  before going to the ED for breathing or allergy  symptoms since we might be able to fit you in for a sick visit. Feel free to contact us  anytime with any questions, problems, or concerns.  It was a pleasure to see you again today!  Websites that have reliable patient information: 1. American Academy of Asthma, Allergy , and Immunology: www.aaaai.org 2. Food Industrial/product Designer and Education (FARE): foodallergy.org 3. Mothers of Asthmatics: http://www.asthmacommunitynetwork.org 4. American College of Allergy , Asthma, and Immunology: www.acaai.org

## 2024-11-29 ENCOUNTER — Ambulatory Visit: Admitting: Family

## 2024-11-29 ENCOUNTER — Other Ambulatory Visit: Payer: Self-pay

## 2024-11-29 ENCOUNTER — Encounter: Payer: Self-pay | Admitting: Family

## 2024-11-29 DIAGNOSIS — Z8709 Personal history of other diseases of the respiratory system: Secondary | ICD-10-CM | POA: Diagnosis not present

## 2024-11-29 DIAGNOSIS — Z91014 Allergy to mammalian meats: Secondary | ICD-10-CM

## 2024-11-29 DIAGNOSIS — J3089 Other allergic rhinitis: Secondary | ICD-10-CM

## 2024-11-29 DIAGNOSIS — L858 Other specified epidermal thickening: Secondary | ICD-10-CM

## 2024-11-29 DIAGNOSIS — J302 Other seasonal allergic rhinitis: Secondary | ICD-10-CM

## 2024-11-29 MED ORDER — CETIRIZINE HCL 10 MG PO TABS: ORAL_TABLET | ORAL | 5 refills | Status: AC

## 2024-11-29 NOTE — Progress Notes (Signed)
 [address] [place] [address] Dept: [phone]  FOLLOW UP NOTE  Patient ID: Connie Rivera, female    DOB: 2001-05-08  Age: 23 y.o. MRN: [id number] Date of Office Visit: 11/29/2024  Assessment  Chief Complaint: alpha gal (No issues. ) and Allergic Rhinitis  (Doing well. 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  She was last seen on August 25, 2024 by Dr. Iva.  She denies any new diagnosis or surgery since her last office visit.  Her older sister is here with her today and helps provide history.  Alpha-gal: She reports that she continues to avoid all mammalian meat without any accidental ingestion or use of her epinephrine  autoinjector device.  She reports that her EpiPen  is up-to-date.  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  She would like to get lab work to follow-up on her alpha gal allergy  due to her insurance potentially changing.  Pustular lesions-unsure trigger.  She reports that she has not had any breakouts since her last office visit.  She has not needed to use triamcinolone  0.5% ointment.  She reports that she would sometimes have breakouts of bumps of hives that would last a few days.  Allergic rhinitis: She currently takes Singulair  10 mg daily and Flonase  nasal spray.  Her allergies are better.  She does live with 3 dogs which she is allergic to.  She does report that she can breathe through her nose now.  She reports some rhinorrhea, nasal congestion and a little bit of postnasal drip, but it is way better than it used to be.  She is not taking any antihistamines.  She felt like Xyzal  made her sleepy.  She has been on Zyrtec  in the past and did not have any problems.  She has not been treated for any sinus infections since we last  saw her, but since her last office visit she was told that she had her upper respiratory infection that lasted about 2 weeks.  She reports that she was negative for COVID and flu and did not receive any antibiotics.  She reports that she had a bad asthma as a child and was put on oxygen when she was born.  She had a nebulizer, inhaler, and Singulair .  She reports that she no longer has issues with her asthma and has not used an inhaler in 6 years.  She denies cough, wheeze, tightness in chest, shortness of breath, and nocturnal awakenings due to breathing problems.  Since her last office visit she has not made any trips to the emergency room or urgent care due to breathing problems.  She mentions that she no longer smokes and this probably helps.  She used to smoke THC and tobacco.  She wonders the chances of her child developing asthma.   Drug Allergies:  Allergies  Allergen Reactions   Hydrocodone Palpitations   Pineapple Anaphylaxis and Itching   Wound Dressing Adhesive Itching and Rash   Adhesive [Tape] Rash   Alpha-Gal Hives, Itching, Nausea And Vomiting, Rash and Swelling   Latex Rash    Review of Systems: Negative except as per HPI   Physical Exam: BP 100/80   Pulse 88   Temp (!) 97.2 F (36.2 C)   SpO2 96%    Physical Exam  Constitutional:      Appearance: Normal appearance.  HENT:     Head: Normocephalic and atraumatic.     Comments: Pharynx normal, eyes normal, ears normal, nose: Bilateral lower turbinates mildly edematous with no drainage noted    Right Ear: Tympanic membrane, ear canal and external ear normal.     Left Ear: Tympanic membrane, ear canal and external ear normal.     Mouth/Throat:     Mouth: Mucous membranes are moist.     Pharynx: Oropharynx is clear.  Eyes:     Conjunctiva/sclera: Conjunctivae normal.  Cardiovascular:     Rate and Rhythm: Regular rhythm.     Heart sounds: Normal heart sounds.  Pulmonary:     Effort: Pulmonary effort is normal.      Breath sounds: Normal breath sounds.     Comments: Lungs clear to auscultation Musculoskeletal:     Cervical back: Neck supple.  Skin:    General: Skin is warm.  Neurological:     Mental Status: She is alert and oriented to person, place, and time.  Psychiatric:        Mood and Affect: Mood normal.        Behavior: Behavior normal.        Thought Content: Thought content normal.        Judgment: Judgment normal.     Diagnostics: None  Assessment and Plan: 1. Alpha-gal syndrome   2. Seasonal and perennial allergic rhinitis   3. Keratosis pilaris   4. History of asthma     Meds ordered this encounter  Medications   cetirizine  (ZYRTEC  ALLERGY ) 10 MG tablet    Sig: Take 1 tablet by mouth once a day as needed for runny nose/itching.  Caution as this may make you sleepy    Dispense:  30 tablet    Refill:  5    Patient Instructions  1. Alpha-gal syndrome  - Continue to avoid mammalian meats.  - We will get lab work to follow up on this. We will call you with results once they are back - EpiPen  is up date.   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   3. Keratosis pilaris - Continue Amlactin daily to help clear up the skin pores. - Information on KP provided at previous visit.   4. Pustular lesions - unsure trigger - Continue triamcinolone  0.5% ointment twice daily for 1-2 weeks to see if that clears them up.  Do not use longer than 7 to 10 days in a row.  Do not use on face, neck, groin, or armpit region  4. Allergic rhinitis - Testing at the last visit showed: indoor molds, outdoor molds, dust mites, and dog - Start Zyrtec  (cetirizine ) 5mg  tablet once daily, and continue Singulair  (montelukast ) 10mg  daily, and Flonase  (fluticasone ) one spray per nostril daily (AIM FOR EAR ON EACH SIDE) - You can use an extra dose of the antihistamine, if needed, for breakthrough symptoms.  - Consider nasal saline rinses 1-2 times daily to  remove allergens from the nasal cavities as well as help with mucous clearance (this is especially helpful to do before the nasal sprays are given)  5. Follow up in 6 months or sooner if needed   Please inform us  of any Emergency Department visits, hospitalizations, or changes in symptoms. Call us  before going to the ED for breathing or allergy  symptoms since we might be able to fit you in for a sick visit. Feel free  to contact us  anytime with any questions, problems, or concerns.  It was a pleasure to see you again today!  Websites that have reliable patient information: 1. American Academy of Asthma, Allergy , and Immunology: www.aaaai.org 2. Food Allergy  Research and Education (FARE): foodallergy.org 3. Mothers of Asthmatics: http://www.asthmacommunitynetwork.org 4. [employer] of Allergy , Asthma, and Immunology: www.acaai.org       Return in about 6 months (around 05/30/2025), or if symptoms worsen or fail to improve.    Thank you for the opportunity to care for this patient.  Please do not hesitate to contact me with questions.  Wanda Craze, FNP [facility] 

## 2024-12-02 LAB — ALPHA-GAL PANEL
Allergen Lamb IgE: 2.72 kU/L — AB
Beef IgE: 3.73 kU/L — AB
IgE (Immunoglobulin E), Serum: 39 [IU]/mL (ref 6–495)
O215-IgE Alpha-Gal: 4.95 kU/L — AB
Pork IgE: 2.58 kU/L — AB

## 2024-12-03 ENCOUNTER — Ambulatory Visit: Payer: Self-pay | Admitting: Family

## 2024-12-03 NOTE — Progress Notes (Signed)
 Please let Connie Rivera know that her lab work for alpha gal is still  very elevated, but lower than 7 months ago. She needs to continue to avoid all mammalian meat (red meats) and have access to her epinephrine  auto injector device at all times.

## 2024-12-06 DIAGNOSIS — E66813 Obesity, class 3: Secondary | ICD-10-CM | POA: Diagnosis not present

## 2024-12-06 DIAGNOSIS — Z6841 Body Mass Index (BMI) 40.0 and over, adult: Secondary | ICD-10-CM | POA: Diagnosis not present

## 2024-12-06 DIAGNOSIS — K760 Fatty (change of) liver, not elsewhere classified: Secondary | ICD-10-CM | POA: Diagnosis not present

## 2024-12-28 ENCOUNTER — Telehealth: Payer: Self-pay | Admitting: *Deleted

## 2024-12-28 ENCOUNTER — Other Ambulatory Visit: Payer: Self-pay | Admitting: *Deleted

## 2024-12-28 NOTE — Patient Instructions (Signed)
 Visit Information  Connie Rivera was given information about Medicaid Managed Care team care coordination services as a part of their Healthy [facility] Medicaid benefit. MICALA SALTSMAN   If you would like to schedule transportation through your Healthy [facility] plan, please call the following number at least 2 days in advance of your appointment: [phone]  For information about your ride after you set it up, call Ride Assist at [phone]. Use this number to activate a Will Call pickup, or if your transportation is late for a scheduled pickup. Use this number, too, if you need to make a change or cancel a previously scheduled reservation.  If you need transportation services right away, call [phone]. The after-hours call center is staffed 24 hours to handle ride assistance and urgent reservation requests (including discharges) 365 days a year. Urgent trips include sick visits, hospital discharge requests and life-sustaining treatment.  Call the [facility] Line at [phone], at any time, 24 hours a day, 7 days a week. If you are in danger or need immediate medical attention call 911.   Please see education materials related to Health Maintenance provided by MyChart link.  Patient verbalizes understanding of instructions and care plan provided today and agrees to view in MyChart. Active MyChart status and patient understanding of how to access instructions and care plan via MyChart confirmed with patient.     Telephone follow up appointment with Managed Medicaid care management team member scheduled for:01-29-2025 at 2:00 pm  Rosina Forte, BSN RN [facility], [facility] RN Care Manager Direct Dial: [phone]  Fax: [phone]   Following is a copy of your plan of care:   Goals Addressed             This Visit's Progress    VBCI RN Care Plan : Health Maintenance   On track    Problems:  Care Coordination needs related to  Health Maintenance  Goal: Over the next 90 days the Patient will attend all scheduled medical appointments: with primary care provider and specialist as evidenced by keeping all scheduled appointments        continue to work with RN Care Manager and/or Social Worker to address care management and care coordination needs related to health maintenance as evidenced by adherence to care management team scheduled appointments     take all medications exactly as prescribed and will call provider for medication related questions as evidenced by compliance with all medications     Interventions:   Health Maintenance Interventions: Patient interviewed about adult health maintenance status including  Regular Dental Care. Patient reports that she has not been to the dentist due to her diagnosis of Alpha Gal and most dental offices only use animal products. She does endorse needing a visit. Attempted to make an appointment, office unable to confirm if they can meet her animal free derivative product need. Patient to call back and confirm. Plans to explore other dental institutions to ensure patient dental care needs are met. Advised to discuss Regular Dental Care with primary care provider due to alpha gal syndrome Coordination of Care to address Dental Health needs - [facility] [phone].   Patient Self-Care Activities:  Attend all scheduled provider appointments Call provider office for new concerns or questions  Take medications as prescribed    Plan:  Telephone follow up appointment with care management team member scheduled for:  01-29-2025 at 2:00 pm

## 2024-12-28 NOTE — Patient Outreach (Signed)
 Complex Care Management   Visit Note  12/28/2024  Name:  Connie Rivera MRN: [id number] DOB: 08-26-2001  Situation: Referral received for Complex Care Management related to Health Maintenance I obtained verbal consent from Patient.  Visit completed with Patient  on the phone  Background:   Past Medical History:  Diagnosis Date   Amenorrhea 07/10/2015   Asthma    Depression    Irregular periods 07/10/2015   Obesity    Scoliosis    Sleep apnea     Assessment: Patient Reported Symptoms:  Cognitive Cognitive Status: No symptoms reported Cognitive/Intellectual Conditions Management [RPT]: None reported or documented in medical history or problem list   Health Maintenance Behaviors: Annual physical exam Healing Pattern: Average Health Facilitated by: Rest  Neurological Neurological Review of Symptoms: No symptoms reported Neurological Management Strategies: Routine screening Neurological Self-Management Outcome: 4 (good)  HEENT HEENT Symptoms Reported: Sore throat HEENT Management Strategies: Routine screening HEENT Self-Management Outcome: 4 (good)    Cardiovascular Cardiovascular Symptoms Reported: No symptoms reported Does patient have uncontrolled Hypertension?: No Cardiovascular Management Strategies: Routine screening Cardiovascular Self-Management Outcome: 5 (very good)  Respiratory Respiratory Symptoms Reported: No symptoms reported Respiratory Management Strategies: Routine screening Respiratory Self-Management Outcome: 4 (good)  Endocrine Endocrine Symptoms Reported: No symptoms reported Is patient diabetic?: No Endocrine Self-Management Outcome: 4 (good)  Gastrointestinal Gastrointestinal Symptoms Reported: Constipation Gastrointestinal Self-Management Outcome: 4 (good)    Genitourinary Genitourinary Symptoms Reported: No symptoms reported Genitourinary Self-Management Outcome: 4 (good)  Integumentary Integumentary Symptoms Reported: No symptoms reported Skin  Management Strategies: Routine screening Skin Self-Management Outcome: 4 (good)  Musculoskeletal Musculoskelatal Symptoms Reviewed: No symptoms reported Musculoskeletal Management Strategies: Routine screening Musculoskeletal Self-Management Outcome: 4 (good) Falls in the past year?: No Number of falls in past year: 1 or less Was there an injury with Fall?: No Fall Risk Category Calculator: 0 Patient Fall Risk Level: Low Fall Risk Patient at Risk for Falls Due to: No Fall Risks Fall risk Follow up: Falls evaluation completed  Psychosocial Psychosocial Symptoms Reported: No symptoms reported Behavioral Management Strategies: Coping strategies Behavioral Health Self-Management Outcome: 4 (good) Major Change/Loss/Stressor/Fears (CP): Denies Techniques to Cope with Loss/Stress/Change: Diversional activities      12/28/2024    PHQ2-9 Depression Screening   Little interest or pleasure in doing things Not at all  Feeling down, depressed, or hopeless Not at all  PHQ-2 - Total Score 0  Trouble falling or staying asleep, or sleeping too much    Feeling tired or having little energy    Poor appetite or overeating     Feeling bad about yourself - or that you are a failure or have let yourself or your family down    Trouble concentrating on things, such as reading the newspaper or watching television    Moving or speaking so slowly that other people could have noticed.  Or the opposite - being so fidgety or restless that you have been moving around a lot more than usual    Thoughts that you would be better off dead, or hurting yourself in some way    PHQ2-9 Total Score    If you checked off any problems, how difficult have these problems made it for you to do your work, take care of things at home, or get along with other people    Depression Interventions/Treatment      There were no vitals filed for this visit. Pain Scale: 0-10 Pain Score: 0-No pain  Medications Reviewed Today  Reviewed by Bertrum Rosina HERO, RN (Registered Nurse) on 12/28/24 at 1539  Med List Status: <None>   Medication Order Taking? Sig Documenting Provider Last Dose Status Informant  AMLACTIN DAILY 12 % lotion [id number] Yes Apply 1 Application topically as needed (keratosis pilaris). Iva Marty Saltness, MD  Active   cetirizine  (ZYRTEC  ALLERGY ) 10 MG tablet [id number] Yes Take 1 tablet by mouth once a day as needed for runny nose/itching.  Caution as this may make you sleepy Cheryl Reusing, FNP  Active   EPINEPHrine  0.3 mg/0.3 mL IJ SOAJ injection [id number] Yes Inject 0.3 mg into the muscle as needed for anaphylaxis. Iva Marty Saltness, MD  Active   fluticasone  (FLONASE ) 50 MCG/ACT nasal spray [id number] Yes Place 2 sprays into both nostrils daily. Iva Marty Saltness, MD  Active   metFORMIN (GLUCOPHAGE-XR) 500 MG 24 hr tablet [id number] Yes Take 500 mg by mouth 2 (two) times daily with a meal. [provider]  Active   montelukast  (SINGULAIR ) 10 MG tablet [id number] Yes Take 1 tablet (10 mg total) by mouth at bedtime. Iva Marty Saltness, MD  Active   triamcinolone  ointment (KENALOG ) 0.5 % [id number] Yes Apply 1 Application topically 2 (two) times daily. Use for 1-2 weeks until lesions are cleared. DO NOT use on the face. Iva Marty Saltness, MD  Active   ZEPBOUND 2.5 MG/0.5ML Pen [id number] Yes 0.5 mL Subcutaneous weekly; Duration: 30 days [provider]  Active             Recommendation:   Continue Current Plan of Care  Follow Up Plan:   Telephone follow-up in 1 month  Rosina Bertrum, BSN RN [facility], [facility] RN Care Manager Direct Dial: [phone]  Fax: [phone]

## 2025-01-12 ENCOUNTER — Other Ambulatory Visit: Payer: Self-pay | Admitting: Allergy & Immunology

## 2025-01-29 ENCOUNTER — Telehealth: Admitting: *Deleted
